# Patient Record
Sex: Female | Born: 1995 | Race: Black or African American | Hispanic: No | Marital: Single | Smoking: Never smoker
Health system: Southern US, Community
[De-identification: ages and names within clinical notes are randomized; demographics above are authoritative.]

## PROBLEM LIST (undated history)

## (undated) DIAGNOSIS — J45909 Unspecified asthma, uncomplicated: Secondary | ICD-10-CM

## (undated) DIAGNOSIS — T7840XA Allergy, unspecified, initial encounter: Secondary | ICD-10-CM

## (undated) DIAGNOSIS — F329 Major depressive disorder, single episode, unspecified: Secondary | ICD-10-CM

## (undated) DIAGNOSIS — D649 Anemia, unspecified: Secondary | ICD-10-CM

## (undated) DIAGNOSIS — F410 Panic disorder [episodic paroxysmal anxiety] without agoraphobia: Secondary | ICD-10-CM

## (undated) DIAGNOSIS — F32A Depression, unspecified: Secondary | ICD-10-CM

## (undated) HISTORY — DX: Allergy, unspecified, initial encounter: T78.40XA

## (undated) HISTORY — DX: Anemia, unspecified: D64.9

## (undated) HISTORY — DX: Unspecified asthma, uncomplicated: J45.909

---
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
## 2017-09-19 ENCOUNTER — Emergency Department (HOSPITAL_COMMUNITY)
Admission: EM | Admit: 2017-09-19 | Discharge: 2017-09-19 | Disposition: A | Discharge status: Other Acute Inpatient Hospital | Payer: BC Managed Care – PPO

## 2017-10-15 ENCOUNTER — Ambulatory Visit (HOSPITAL_COMMUNITY)
Admission: EM | Admit: 2017-10-15 | Discharge: 2017-10-15 | Disposition: A | Discharge status: Home / Self Care | Payer: BC Managed Care – PPO | Attending: Family Medicine | Admitting: Family Medicine

## 2017-10-15 ENCOUNTER — Encounter (HOSPITAL_COMMUNITY): Payer: Self-pay

## 2017-10-15 DIAGNOSIS — S39012A Strain of muscle, fascia and tendon of lower back, initial encounter: Secondary | ICD-10-CM | POA: Diagnosis not present

## 2017-10-15 DIAGNOSIS — Z041 Encounter for examination and observation following transport accident: Secondary | ICD-10-CM | POA: Diagnosis not present

## 2017-10-15 MED ORDER — DICLOFENAC SODIUM 75 MG PO TBEC: 75.0000 mg | DELAYED_RELEASE_TABLET | Freq: Two times a day (BID) | ORAL | 0 refills | Status: DC

## 2017-10-15 MED ORDER — CYCLOBENZAPRINE HCL 5 MG PO TABS: 5.0000 mg | ORAL_TABLET | Freq: Every day | ORAL | 0 refills | Status: DC
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
## 2017-10-29 ENCOUNTER — Inpatient Hospital Stay (HOSPITAL_COMMUNITY): Admission: AD | Admit: 2017-10-29 | Payer: BC Managed Care – PPO | Admitting: Psychiatry

## 2017-10-29 ENCOUNTER — Other Ambulatory Visit: Payer: Self-pay

## 2017-10-29 ENCOUNTER — Encounter (HOSPITAL_BASED_OUTPATIENT_CLINIC_OR_DEPARTMENT_OTHER): Payer: Self-pay | Admitting: *Deleted

## 2017-10-29 ENCOUNTER — Emergency Department (HOSPITAL_BASED_OUTPATIENT_CLINIC_OR_DEPARTMENT_OTHER)
Admission: EM | Admit: 2017-10-29 | Discharge: 2017-10-29 | Disposition: A | Discharge status: Home / Self Care | Payer: BC Managed Care – PPO | Attending: Emergency Medicine | Admitting: Emergency Medicine

## 2017-10-29 DIAGNOSIS — F329 Major depressive disorder, single episode, unspecified: Secondary | ICD-10-CM | POA: Diagnosis present

## 2017-10-29 DIAGNOSIS — F322 Major depressive disorder, single episode, severe without psychotic features: Secondary | ICD-10-CM | POA: Insufficient documentation

## 2017-10-29 DIAGNOSIS — R45851 Suicidal ideations: Secondary | ICD-10-CM | POA: Diagnosis not present

## 2017-10-29 DIAGNOSIS — Z79899 Other long term (current) drug therapy: Secondary | ICD-10-CM | POA: Insufficient documentation

## 2017-10-29 HISTORY — DX: Panic disorder (episodic paroxysmal anxiety): F41.0

## 2017-10-29 HISTORY — DX: Major depressive disorder, single episode, unspecified: F32.9

## 2017-10-29 HISTORY — DX: Depression, unspecified: F32.A

## 2017-10-29 LAB — CBC
HEMATOCRIT: 39.8 % (ref 36.0–46.0)
HEMOGLOBIN: 12.7 g/dL (ref 12.0–15.0)
MCH: 27.5 pg (ref 26.0–34.0)
MCHC: 31.9 g/dL (ref 30.0–36.0)
MCV: 86.1 fL (ref 80.0–100.0)
PLATELETS: 241 10*3/uL (ref 150–400)
RBC: 4.62 MIL/uL (ref 3.87–5.11)
RDW: 14.2 % (ref 11.5–15.5)
WBC: 5.7 10*3/uL (ref 4.0–10.5)
nRBC: 0 % (ref 0.0–0.2)

## 2017-10-29 LAB — RAPID URINE DRUG SCREEN, HOSP PERFORMED
AMPHETAMINES: NOT DETECTED
BARBITURATES: NOT DETECTED
Benzodiazepines: NOT DETECTED
Cocaine: NOT DETECTED
Opiates: NOT DETECTED
TETRAHYDROCANNABINOL: NOT DETECTED

## 2017-10-29 LAB — COMPREHENSIVE METABOLIC PANEL
ALK PHOS: 54 U/L (ref 38–126)
ALT: 16 U/L (ref 0–44)
AST: 22 U/L (ref 15–41)
Albumin: 4.3 g/dL (ref 3.5–5.0)
Anion gap: 9 (ref 5–15)
BUN: 16 mg/dL (ref 6–20)
CHLORIDE: 105 mmol/L (ref 98–111)
CO2: 25 mmol/L (ref 22–32)
CREATININE: 0.67 mg/dL (ref 0.44–1.00)
Calcium: 9.6 mg/dL (ref 8.9–10.3)
GFR calc Af Amer: >60 mL/min (ref >60)
GFR calc non Af Amer: >60 mL/min (ref >60)
Glucose, Bld: 93 mg/dL (ref 70–99)
Potassium: 3.6 mmol/L (ref 3.5–5.1)
SODIUM: 139 mmol/L (ref 135–145)
Total Bilirubin: 0.4 mg/dL (ref 0.3–1.2)
Total Protein: 7.6 g/dL (ref 6.5–8.1)

## 2017-10-29 LAB — SALICYLATE LEVEL: Salicylate Lvl: <7 mg/dL (ref 2.8–30.0)

## 2017-10-29 LAB — ACETAMINOPHEN LEVEL: Acetaminophen (Tylenol), Serum: <10 ug/mL — ABNORMAL LOW (ref 10–30)

## 2017-10-29 LAB — PREGNANCY, URINE: PREG TEST UR: NEGATIVE

## 2017-10-29 LAB — ETHANOL: Alcohol, Ethyl (B): <10 mg/dL (ref <10)

## 2017-10-29 NOTE — ED Notes (Signed)
ED Provider at bedside. 

## 2017-10-29 NOTE — ED Notes (Signed)
Pt talking with TTS telepsych.

## 2017-10-29 NOTE — ED Provider Notes (Signed)
22 yo F with a chief complaint of suicidal ideation.  Received the patient in signout from Dr. Ellender Hose.  Briefly the plan was for TTS evaluation.  Per him the concern was that the patient had stated that she was going to try and drive her car off the road to harm herself.  TTS had evaluated the patient and felt that she met inpatient criteria.  The family felt that there was a disconnect between their conversations.  They thought that by coming into the hospital she may get to see the psychiatrist sooner but be able to go home once that have been completed.  Once they found out that she would have a 72-hour at minimum admission but they felt that the patient would be better handled as an outpatient.  They try to recontact TTS who felt that at this point the patient would need to be involuntary committed.  The family and the patient seem reasonable on my exam.  The family is willing to contract for safety.  Patient is not suicidal at this time.  I feel that they are safe for discharge and outpatient psychiatric evaluation.   Deno Etienne, DO 10/29/17 1900

## 2017-10-29 NOTE — ED Notes (Signed)
Pt talking with TTS again

## 2017-10-29 NOTE — BH Assessment (Signed)
Tele Assessment Note   Patient Name: NYELA CORTINAS MRN: [id number] Referring Physician: Shaune Pollack MD Location of Patient: [facility] Location of Provider: Behavioral Health TTS Department  Heide Spark Lawn[place]ak is an 22 y.o. female presents to [facility] with worsening depression and SI with thoughts of running vehicle off the road. Pt accompanied by parents. Pt has not been compliant with medication the past few days and reports she has never been this depressed in her life. Pt reports feeling overwhelmed with feelings of depression, school and feeling like she was not as close to Jesus in her faith. Pt denies any current or past substance abuse problems. Pt does not appear to be intoxicated or in withdrawal at this time. Pt denies homicidal thoughts or physical aggression. Pt denies having access to firearms. Pt denies having any legal problems at this time. Pt denies hallucinations. Pt does not appear to be responding to internal stimuli and exhibits no delusional thought. Pt's reality testing appears to be intact. Pt has a job and goes to [employer]. Pr lives with roommates. Pt has no hs of inpatient services. Pt is prescribed meds by PCP and has a therapist at [employer].   Pt is dressed in street clothes, alert, oriented x4 with normal speech and normal motor behavior. Eye contact is good and Pt is calm. Pt's mood is depressed and affect is anxious. Thought process is coherent and relevant. Pt's insight is poor and judgement is impaired. There is no indication Pt is currently responding to internal stimuli or experiencing delusional thought content. Pt was cooperative throughout assessment. She says she is willing to sign voluntarily into a psychiatric facility    Diagnosis: F32.2 Major depressive disorder, Single episode, Severe   Past Medical History:  Past Medical History:  Diagnosis Date  . Depression   . Panic attacks     History reviewed. No pertinent surgical history.  Family  History: No family history on file.  Social History:  reports that she has never smoked. She has never used smokeless tobacco. She reports that she does not drink alcohol or use drugs.  Additional Social History:  Alcohol / Drug Use Pain Medications: See MAR Prescriptions: See MAR Over the Counter: See MAR History of alcohol / drug use?: No history of alcohol / drug abuse  CIWA: CIWA-Ar BP: 120/69 Pulse Rate: 74 COWS:    Allergies:  Allergies  Allergen Reactions  . Excedrin Extra Strength [Aspirin-Acetaminophen-Caffeine]     Home Medications:  (Not in a hospital admission)  OB/GYN Status:  Patient's last menstrual period was 10/18/2017.  General Assessment Data Location of Assessment: [facility] TTS Assessment: In system Is this a Tele or Face-to-Face Assessment?: Tele Assessment Is this an Initial Assessment or a Re-assessment for this encounter?: Initial Assessment Patient Accompanied by:: Parent What gender do you identify as?: Female Marital status: Single Pregnancy Status: No Living Arrangements: Non-relatives/Friends Can pt return to current living arrangement?: Yes Admission Status: Voluntary Is patient capable of signing voluntary admission?: Yes Referral Source: Self/Family/Friend Insurance type: BCBS  Medical Screening Exam [facility] Walk-in ONLY) Medical Exam completed: Yes  Crisis Care Plan Living Arrangements: Non-relatives/Friends Name of Psychiatrist: None Name of Therapist: [employer]  Education Status Is patient currently in school?: Yes Current Grade: [employer] Highest grade of school patient has completed: 12 Name of school: [employer]  Risk to self with the past 6 months Suicidal Ideation: Yes-Currently Present Has patient been a risk to self within the past 6 months prior to admission? :  No Suicidal Intent: No Has patient had any suicidal intent within the past 6 months prior to admission? : No Is patient at risk for suicide?: Yes Suicidal Plan?:  Yes-Currently Present Has patient had any suicidal plan within the past 6 months prior to admission? : No Specify Current Suicidal Plan: Run off the road Access to Means: No What has been your use of drugs/alcohol within the last 12 months?: None Previous Attempts/Gestures: No Intentional Self Injurious Behavior: None Family Suicide History: No Recent stressful life event(s): Conflict (Comment), Other (Comment)(College) Persecutory voices/beliefs?: No Depression: Yes Depression Symptoms: Despondent, Insomnia, Isolating, Feeling worthless/self pity Substance abuse history and/or treatment for substance abuse?: No Suicide prevention information given to non-admitted patients: Not applicable  Risk to Others within the past 6 months Homicidal Ideation: No Does patient have any lifetime risk of violence toward others beyond the six months prior to admission? : No Thoughts of Harm to Others: No Current Homicidal Intent: No Current Homicidal Plan: No Access to Homicidal Means: No History of harm to others?: No Assessment of Violence: None Noted Does patient have access to weapons?: No Criminal Charges Pending?: No Does patient have a court date: No Is patient on probation?: No  Psychosis Hallucinations: None noted Delusions: None noted  Mental Status Report Appearance/Hygiene: In scrubs Eye Contact: Good Motor Activity: Freedom of movement Speech: Logical/coherent Level of Consciousness: Quiet/awake Mood: Depressed Affect: Anxious Anxiety Level: Minimal Thought Processes: Coherent, Relevant Orientation: Person, Place, Time, Situation, Appropriate for developmental age Obsessive Compulsive Thoughts/Behaviors: None  Cognitive Functioning Is patient IDD: No Insight: Poor Impulse Control: Poor Appetite: Poor Have you had any weight changes? : No Change Sleep: Decreased Total Hours of Sleep: 4 Vegetative Symptoms: None  ADLScreening [facility] Assessment Services) Patient's  cognitive ability adequate to safely complete daily activities?: Yes Patient able to express need for assistance with ADLs?: Yes Independently performs ADLs?: Yes (appropriate for developmental age)  Prior Inpatient Therapy Prior Inpatient Therapy: No  Prior Outpatient Therapy Prior Outpatient Therapy: Yes Prior Therapy Dates: 2019 Prior Therapy Facilty/Provider(s): [facility] Reason for Treatment: Si Depression Does patient have an ACCT team?: No Does patient have Intensive In-House Services?  : No Does patient have [facility] services? : No Does patient have P4CC services?: No  ADL Screening (condition at time of admission) Patient's cognitive ability adequate to safely complete daily activities?: Yes Is the patient deaf or have difficulty hearing?: No Does the patient have difficulty seeing, even when wearing glasses/contacts?: No Does the patient have difficulty concentrating, remembering, or making decisions?: No Patient able to express need for assistance with ADLs?: Yes Does the patient have difficulty dressing or bathing?: No Independently performs ADLs?: Yes (appropriate for developmental age) Does the patient have difficulty walking or climbing stairs?: No Weakness of Legs: None Weakness of Arms/Hands: None  Home Assistive Devices/Equipment Home Assistive Devices/Equipment: None  Therapy Consults (therapy consults require a physician order) PT Evaluation Needed: No OT Evalulation Needed: No SLP Evaluation Needed: No Abuse/Neglect Assessment (Assessment to be complete while patient is alone) Abuse/Neglect Assessment Can Be Completed: Yes Physical Abuse: Denies Verbal Abuse: Denies Sexual Abuse: Denies Exploitation of patient/patient's resources: Denies Self-Neglect: Denies Values / Beliefs Cultural Requests During Hospitalization: None Spiritual Requests During Hospitalization: None Consults Spiritual Care Consult Needed: No Social Work Consult Needed: No Dispensing optician (For Healthcare) Does Patient Have a Medical Advance Directive?: No Would patient like information on creating a medical advance directive?: No - Patient declined Nutrition Screen- [facility] Adult/[facility]/AP Patient's home diet: Regular  Disposition:  Disposition Initial Assessment Completed for this Encounter: Yes Disposition of Patient: Admit Type of inpatient treatment program: Adult   Per Reola Calkins, NP pt meets inpatient criteria. Accepted to [facility].   This service was provided via telemedicine using a 2-way, interactive audio and video technology.  Names of all persons participating in this telemedicine service and their role in this encounter. Name: Nicola Police Role: Pt  Name: Danae [place], [place], [place] Role: Therapeutic Triage Specialist  Name:  Role:   Name:  Role:     Danae Orleans, [place], [facility] 10/29/2017 6:01 PM

## 2017-10-29 NOTE — ED Notes (Signed)
Pt discharged with parents. Pt agreeable to follow up. Pt signed Engineer, manufacturing systems. Copy provided to patient.
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
## 2017-10-29 NOTE — Discharge Instructions (Signed)
Follow up with your family doc/psychiatrist.  Return for any concern of Suicidally.

## 2017-10-29 NOTE — ED Triage Notes (Signed)
States she has had a series of panic attacks today. States she is in a depressed state today and does not know what is going on with her. She stopped taking her medication for depression several days ago.

## 2019-02-03 DIAGNOSIS — F319 Bipolar disorder, unspecified: Secondary | ICD-10-CM | POA: Insufficient documentation

## 2019-02-03 DIAGNOSIS — D509 Iron deficiency anemia, unspecified: Secondary | ICD-10-CM | POA: Insufficient documentation

## 2019-02-03 DIAGNOSIS — E559 Vitamin D deficiency, unspecified: Secondary | ICD-10-CM | POA: Insufficient documentation

## 2020-02-22 ENCOUNTER — Other Ambulatory Visit: Payer: Self-pay

## 2020-02-22 ENCOUNTER — Ambulatory Visit (HOSPITAL_COMMUNITY)
Admission: EM | Admit: 2020-02-22 | Discharge: 2020-02-22 | Disposition: A | Discharge status: Home / Self Care | Payer: Self-pay

## 2020-02-22 ENCOUNTER — Encounter (HOSPITAL_COMMUNITY): Payer: Self-pay

## 2020-02-22 ENCOUNTER — Ambulatory Visit (INDEPENDENT_AMBULATORY_CARE_PROVIDER_SITE_OTHER): Payer: Self-pay

## 2020-02-22 DIAGNOSIS — M25561 Pain in right knee: Secondary | ICD-10-CM

## 2020-02-22 DIAGNOSIS — M25562 Pain in left knee: Secondary | ICD-10-CM

## 2020-02-22 MED ORDER — CYCLOBENZAPRINE HCL 5 MG PO TABS: 5.0000 mg | ORAL_TABLET | Freq: Every evening | ORAL | 0 refills | Status: DC | PRN

## 2020-02-22 MED ORDER — IBUPROFEN 600 MG PO TABS: 600.0000 mg | ORAL_TABLET | Freq: Four times a day (QID) | ORAL | 0 refills | Status: DC | PRN

## 2020-02-22 MED ORDER — AMITRIPTYLINE HCL 10 MG PO TABS: 10.0000 mg | ORAL_TABLET | Freq: Every day | ORAL | 0 refills | Status: AC

## 2020-02-22 NOTE — Discharge Instructions (Addendum)
Take muscle relaxer at bedtime as needed   Take anti-inflammatory medication every 6 hours  Gentle stretching exercises as tolerated  Heating pad in 15 minute intervals for additional comfort  Follow up with psychiatry for evaluation of medications
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
## 2020-09-25 ENCOUNTER — Ambulatory Visit (HOSPITAL_COMMUNITY)
Admission: EM | Admit: 2020-09-25 | Discharge: 2020-09-25 | Disposition: A | Discharge status: Home / Self Care | Payer: BC Managed Care – PPO | Attending: Physician Assistant | Admitting: Physician Assistant

## 2020-09-25 ENCOUNTER — Other Ambulatory Visit: Payer: Self-pay

## 2020-09-25 ENCOUNTER — Encounter (HOSPITAL_COMMUNITY): Payer: Self-pay | Admitting: *Deleted

## 2020-09-25 DIAGNOSIS — L509 Urticaria, unspecified: Secondary | ICD-10-CM | POA: Diagnosis not present

## 2020-09-25 DIAGNOSIS — T7840XA Allergy, unspecified, initial encounter: Secondary | ICD-10-CM | POA: Diagnosis not present

## 2020-09-25 MED ORDER — EPINEPHRINE 0.3 MG/0.3ML IJ SOAJ: 0.3000 mg | INTRAMUSCULAR | 0 refills | Status: DC | PRN

## 2020-09-25 MED ORDER — METHYLPREDNISOLONE ACETATE 40 MG/ML IJ SUSP
40.0000 mg | Freq: Once | INTRAMUSCULAR | Status: AC
  Administered 2020-09-25: 40 mg via INTRAMUSCULAR

## 2020-09-25 MED ORDER — PREDNISONE 20 MG PO TABS: 40.0000 mg | ORAL_TABLET | Freq: Every day | ORAL | 0 refills | Status: AC

## 2020-09-25 MED ORDER — METHYLPREDNISOLONE ACETATE 40 MG/ML IJ SUSP
INTRAMUSCULAR | Status: AC
  Filled 2020-09-25: qty 1

## 2020-09-25 NOTE — ED Provider Notes (Signed)
[facility]    CSN: [id number] Arrival date & time: 09/25/20  1654      History   Chief Complaint Chief Complaint  Patient presents with   Allergic Reaction    HPI Angela Wolf is a 25 y.o. female.   Patient presents today with a several hour history of lip tingling and hives on her face after eating mahi-mahi.  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  She does report 1 episode of anaphylaxis but does not remember the trigger as this was many years ago.  She does not currently have an EpiPen available.  She has not seen an allergist but is interested in establishing with them.  She denies any shortness of breath, throat/tongue swelling, face swelling, changes to her voice.  She has not tried any over-the-counter medications for symptom management.  She is able to eat and drink despite symptoms.  Typically, she require steroids for improvement of symptoms and to avoid widespread urticaria.   Past Medical History:  Diagnosis Date   Depression    Panic attacks     There are no problems to display for this patient.   No past surgical history on file.  OB History   No obstetric history on file.      Home Medications    Prior to Admission medications   Medication Sig Start Date End Date Taking? Authorizing Provider  EPINEPHrine 0.3 mg/0.3 mL IJ SOAJ injection Inject 0.3 mg into the muscle as needed for anaphylaxis. 09/25/20  Yes Akirah Storck K, PA-C  predniSONE (DELTASONE) 20 MG tablet Take 2 tablets (40 mg total) by mouth daily for 3 days. 09/25/20 09/28/20 Yes Kaysen Sefcik, Noberto Retort, PA-C  amitriptyline (ELAVIL) 10 MG tablet Take 1 tablet (10 mg total) by mouth at bedtime. 02/22/20   White, Elita Boone, NP  cyclobenzaprine (FLEXERIL) 5 MG tablet Take 1 tablet (5 mg total) by mouth at bedtime as needed for muscle spasms. 02/22/20   Valinda Hoar, NP  diclofenac (VOLTAREN) 75 MG EC tablet Take 1 tablet (75 mg total) by mouth 2  (two) times daily. 10/15/17   Elvina Sidle, MD  ibuprofen (ADVIL) 600 MG tablet Take 1 tablet (600 mg total) by mouth every 6 (six) hours as needed. 02/22/20   Valinda Hoar, NP    Family History No family history on file.  Social History Social History   Tobacco Use   Smoking status: Never   Smokeless tobacco: Never  Substance Use Topics   Alcohol use: Never   Drug use: Never     Allergies   Excedrin extra strength [aspirin-acetaminophen-caffeine]   Review of Systems Review of Systems  Constitutional:  Negative for activity change, appetite change, fatigue and fever.  HENT:  Negative for congestion, facial swelling, sore throat, trouble swallowing and voice change.   Respiratory:  Negative for cough and shortness of breath.   Cardiovascular:  Negative for chest pain.  Gastrointestinal:  Negative for abdominal pain, diarrhea and vomiting.  Skin:  Positive for rash.  Neurological:  Negative for dizziness, light-headedness and headaches.    Physical Exam Triage Vital Signs ED Triage Vitals  Enc Vitals Group     BP 09/25/20 1831 125/86     Pulse Rate 09/25/20 1831 78     Resp 09/25/20 1831 18     Temp 09/25/20 1831 (!) 97.4 F (36.3 C)     Temp Source 09/25/20 1831 Oral  SpO2 09/25/20 1831 96 %     Weight --      Height --      Head Circumference --      Peak Flow --      Pain Score 09/25/20 1833 0     Pain Loc --      Pain Edu? --      Excl. in GC? --    No data found.  Updated Vital Signs BP 125/86   Pulse 78   Temp (!) 97.4 F (36.3 C) (Oral)   Resp 18   LMP 09/09/2020   SpO2 96%   Visual Acuity Right Eye Distance:   Left Eye Distance:   Bilateral Distance:    Right Eye Near:   Left Eye Near:    Bilateral Near:     Physical Exam Vitals reviewed.  Constitutional:      General: She is awake. She is not in acute distress.    Appearance: Normal appearance. She is well-developed. She is not ill-appearing.     Comments: Very pleasant  female appears stated age no acute distress sitting comfortably in exam room  HENT:     Head: Normocephalic and atraumatic.     Mouth/Throat:     Pharynx: Uvula midline. No oropharyngeal exudate or posterior oropharyngeal erythema.     Comments: Normal-appearing posterior oropharynx Cardiovascular:     Rate and Rhythm: Normal rate and regular rhythm.     Heart sounds: Normal heart sounds, S1 normal and S2 normal. No murmur heard. Pulmonary:     Effort: Pulmonary effort is normal.     Breath sounds: Normal breath sounds. No stridor. No wheezing, rhonchi or rales.     Comments: Clear to auscultation bilaterally Psychiatric:        Behavior: Behavior is cooperative.     UC Treatments / Results  Labs (all labs ordered are listed, but only abnormal results are displayed) Labs Reviewed - No data to display  EKG   Radiology No results found.  Procedures Procedures (including critical care time)  Medications Ordered in UC Medications  methylPREDNISolone acetate (DEPO-MEDROL) injection 40 mg (40 mg Intramuscular Given 09/25/20 1857)    Initial Impression / Assessment and Plan / UC Course  I have reviewed the triage vital signs and the nursing notes.  Pertinent labs & imaging results that were available during my care of the patient were reviewed by me and considered in my medical decision making (see chart for details).      Vital signs and physical exam reassuring today.  Patient was given 40 mg of Depo-Medrol in clinic today and started on prednisone burst (40 mg for 4 days) tomorrow.  She was encouraged to alternate antihistamines (Zyrtec in the morning and Pepcid at night) to help manage symptoms.  She was prescribed an EpiPen to have on hold but we discussed that should only be used if she has any throat swelling/shortness of breath.  If she requires use of this medication she needs to go directly to the emergency room.  Encouraged her to follow-up with an allergist as she has  several food allergies.  Discussed alarm symptoms that warrant emergent evaluation.  Strict return precautions given to which she expressed understanding.  Final Clinical Impressions(s) / UC Diagnoses   Final diagnoses:  Allergic reaction, initial encounter  Urticaria     Discharge Instructions      We have given you a steroid injection today.  Please start prednisone (40 mg for 3 days)  tomorrow.  Do not take NSAIDs including aspirin, ibuprofen/Advil, naproxen/Aleve with this medication as it can cause stomach bleeding.  Please alternate antihistamines (Zyrtec or cetirizine 10 mg in the morning and Pepcid or famotidine 20 mg at night).  I have called to an EpiPen to be used as needed for severe symptoms including shortness of breath or feeling as though your face/tongue swelling.  If you require use of this medication you need to go to the emergency room.  Please follow-up with an allergist as we discussed.  Avoid all seafood until evaluated by allergist.  If you have any worsening symptoms please go to the hospital.     ED Prescriptions     Medication Sig Dispense Auth. Provider   predniSONE (DELTASONE) 20 MG tablet Take 2 tablets (40 mg total) by mouth daily for 3 days. 6 tablet Haakon Titsworth K, PA-C   EPINEPHrine 0.3 mg/0.3 mL IJ SOAJ injection Inject 0.3 mg into the muscle as needed for anaphylaxis. 1 each Margorie Renner, Noberto Retort, PA-C      PDMP not reviewed this encounter.   Jeani Hawking, PA-C 09/25/20 1922

## 2020-09-25 NOTE — ED Triage Notes (Signed)
Pt reports Mahi-mahi  tonight and developed tingling of lips and facial break.

## 2020-09-25 NOTE — Discharge Instructions (Addendum)
We have given you a steroid injection today.  Please start prednisone (40 mg for 3 days) tomorrow.  Do not take NSAIDs including aspirin, ibuprofen/Advil, naproxen/Aleve with this medication as it can cause stomach bleeding.  Please alternate antihistamines (Zyrtec or cetirizine 10 mg in the morning and Pepcid or famotidine 20 mg at night).  I have called to an EpiPen to be used as needed for severe symptoms including shortness of breath or feeling as though your face/tongue swelling.  If you require use of this medication you need to go to the emergency room.  Please follow-up with an allergist as we discussed.  Avoid all seafood until evaluated by allergist.  If you have any worsening symptoms please go to the hospital.

## 2020-12-25 DIAGNOSIS — F411 Generalized anxiety disorder: Secondary | ICD-10-CM | POA: Insufficient documentation

## 2021-03-04 DIAGNOSIS — F4310 Post-traumatic stress disorder, unspecified: Secondary | ICD-10-CM | POA: Insufficient documentation

## 2021-04-02 ENCOUNTER — Other Ambulatory Visit: Payer: Self-pay

## 2021-04-02 ENCOUNTER — Ambulatory Visit (HOSPITAL_COMMUNITY)
Admission: EM | Admit: 2021-04-02 | Discharge: 2021-04-02 | Disposition: A | Discharge status: Home / Self Care | Payer: BC Managed Care – PPO | Attending: Family Medicine | Admitting: Family Medicine

## 2021-04-02 ENCOUNTER — Encounter (HOSPITAL_COMMUNITY): Payer: Self-pay | Admitting: Emergency Medicine

## 2021-04-02 DIAGNOSIS — M79641 Pain in right hand: Secondary | ICD-10-CM

## 2021-04-02 DIAGNOSIS — S60551A Superficial foreign body of right hand, initial encounter: Secondary | ICD-10-CM

## 2021-04-02 DIAGNOSIS — Z23 Encounter for immunization: Secondary | ICD-10-CM

## 2021-04-02 MED ORDER — HYDROCODONE-ACETAMINOPHEN 5-325 MG PO TABS: 1.0000 | ORAL_TABLET | Freq: Four times a day (QID) | ORAL | 0 refills | Status: DC | PRN

## 2021-04-02 MED ORDER — AMOXICILLIN-POT CLAVULANATE 875-125 MG PO TABS: 1.0000 | ORAL_TABLET | Freq: Two times a day (BID) | ORAL | 0 refills | Status: DC

## 2021-04-02 MED ORDER — TETANUS-DIPHTH-ACELL PERTUSSIS 5-2.5-18.5 LF-MCG/0.5 IM SUSY
0.5000 mL | PREFILLED_SYRINGE | Freq: Once | INTRAMUSCULAR | Status: AC
  Administered 2021-04-02: 0.5 mL via INTRAMUSCULAR

## 2021-04-02 MED ORDER — TETANUS-DIPHTH-ACELL PERTUSSIS 5-2.5-18.5 LF-MCG/0.5 IM SUSY
PREFILLED_SYRINGE | INTRAMUSCULAR | Status: AC
  Filled 2021-04-02: qty 0.5

## 2021-04-02 NOTE — Discharge Instructions (Addendum)

## 2021-04-02 NOTE — ED Triage Notes (Signed)
Pt reports a splinter in the palm of right hand since last night. States the splinter has gone too deep for self removal.  ?

## 2021-04-02 NOTE — ED Provider Notes (Addendum)
?[facility] ? ? ?[id number] ?04/02/21 Arrival Time: [phone] ? ?ASSESSMENT & PLAN: ? ?1. Right hand pain   ?2. Splinter of hand, right, initial encounter   ? ?Procedure Note ? ?Anesthesia: 2% plain lidocaine ? ?Procedure Details  ?The procedure, risks and complications have been discussed in detail (including, but not limited to pain and bleeding) with the patient. ? ?The skin induration was prepped and draped in the usual fashion. ?After adequate local anesthesia, a sub-cm incision made over R palm at reported entry site of wooden splinter with a #11 blade; careful dissection of underlying tissue without location of foreign body. Being cautious, I discussed that I do not want to go deeper into the palm. Will plan on hand surgery evaluation/follow up. Single 5-0 Ethilon suture to close wound. ? ?EBL: minimal ?Drains: none ?Packing: n/a ?Condition: Tolerated procedure well ?Complications: none. ? ?Finger ROM and capillary refill and distal sensation normal before and after procedure. ? ?Meds ordered this encounter  ?Medications  ? amoxicillin-clavulanate (AUGMENTIN) 875-125 MG tablet  ?  Sig: Take 1 tablet by mouth every 12 (twelve) hours.  ?  Dispense:  14 tablet  ?  Refill:  0  ? HYDROcodone-acetaminophen (NORCO/VICODIN) 5-325 MG tablet  ?  Sig: Take 1 tablet by mouth every 6 (six) hours as needed for moderate pain or severe pain.  ?  Dispense:  4 tablet  ?  Refill:  0  ? Tdap (BOOSTRIX) injection 0.5 mL  ? ?Reviewed expectations re: course of current medical issues. Questions answered. ?Outlined signs and symptoms indicating need for more acute intervention. ?Patient verbalized understanding. ?After Visit Summary given. ? ? ?SUBJECTIVE: ? ?Angela Wolf is a 26 y.o. female who presents with a possible wooden foreign body of her R palm; plywood. Painful yest evening; unsuccessful self-removal. No extremity sensation changes or weakness. Unsure of TD. ? ?OBJECTIVE: ? ?Vitals:  ? 04/02/21 1021 04/02/21  1023  ?BP:  101/64  ?Pulse:  67  ?Resp:  16  ?Temp:  98.7 ?F (37.1 ?C)  ?TempSrc:  Oral  ?SpO2:  100%  ?Weight: 52.2 kg   ?Height: 5' (1.524 m)   ?  ? ?General appearance: alert; no distress ?RUE: reported TTP over radial proximal palm; small cut present without active bleeding; unable to palpate any FB ?Psychological: alert and cooperative; normal mood and affect ? ?Allergies  ?Allergen Reactions  ? Excedrin Extra Strength [Aspirin-Acetaminophen-Caffeine]   ? ? ?Past Medical History:  ?Diagnosis Date  ? Depression   ? Panic attacks   ? ?Social History  ? ?Socioeconomic History  ? Marital status: Single  ?  Spouse name: Not on file  ? Number of children: Not on file  ? Years of education: Not on file  ? Highest education level: Not on file  ?Occupational History  ? Not on file  ?Tobacco Use  ? Smoking status: Never  ? Smokeless tobacco: Never  ?Substance and Sexual Activity  ? Alcohol use: Never  ? Drug use: Never  ? Sexual activity: Not on file  ?Other Topics Concern  ? Not on file  ?Social History Narrative  ? Not on file  ? ?Social Determinants of Health  ? ?Financial Resource Strain: Not on file  ?Food Insecurity: Not on file  ?Transportation Needs: Not on file  ?Physical Activity: Not on file  ?Stress: Not on file  ?Social Connections: Not on file  ? ?Family History  ?Problem Relation Age of Onset  ? Healthy Mother   ? Hypertension  Father   ? Asthma Father   ? Fibromyalgia Father   ? ?History reviewed. No pertinent surgical history. ? ? ? ? ? ? ? ?  ?Mardella Layman, MD ?04/02/21 1100 ? ?  ?Mardella Layman, MD ?04/02/21 1105 ? ?  ?Mardella Layman, MD ?04/02/21 1105 ? ?

## 2021-04-24 DIAGNOSIS — S60559A Superficial foreign body of unspecified hand, initial encounter: Secondary | ICD-10-CM | POA: Insufficient documentation

## 2021-06-18 DIAGNOSIS — F902 Attention-deficit hyperactivity disorder, combined type: Secondary | ICD-10-CM | POA: Insufficient documentation

## 2021-10-18 ENCOUNTER — Ambulatory Visit (HOSPITAL_COMMUNITY)
Admission: EM | Admit: 2021-10-18 | Discharge: 2021-10-18 | Disposition: A | Discharge status: Home / Self Care | Payer: BC Managed Care – PPO | Attending: Emergency Medicine | Admitting: Emergency Medicine

## 2021-10-18 DIAGNOSIS — L509 Urticaria, unspecified: Secondary | ICD-10-CM | POA: Diagnosis not present

## 2021-10-18 DIAGNOSIS — T7840XA Allergy, unspecified, initial encounter: Secondary | ICD-10-CM | POA: Diagnosis not present

## 2021-10-18 MED ORDER — METHYLPREDNISOLONE SODIUM SUCC 125 MG IJ SOLR
60.0000 mg | Freq: Once | INTRAMUSCULAR | Status: AC
  Administered 2021-10-18: 60 mg via INTRAMUSCULAR

## 2021-10-18 MED ORDER — METHYLPREDNISOLONE SODIUM SUCC 125 MG IJ SOLR
INTRAMUSCULAR | Status: AC
  Filled 2021-10-18: qty 2

## 2021-10-18 MED ORDER — PREDNISONE 20 MG PO TABS: 20.0000 mg | ORAL_TABLET | Freq: Every day | ORAL | 0 refills | Status: AC

## 2021-10-18 NOTE — ED Triage Notes (Signed)
Pt reports eating crab legs earlier today. She reports throat and arm pits feel itchy. Pt has not taken any medication to help with symptoms.

## 2021-10-18 NOTE — Discharge Instructions (Addendum)
We have given you a steroid injection today. Please take the oral prednisone as prescribed for the next 5 days.  I recommend trying the daily regimen of allergy medicine in the morning (zyrtec, allegra, etc), with pepcid at night time. This can help control your hives and itching.  Follow up with your primary care provider regarding allergies. Our clinic will reach out to you and help you establish within [facility] if you choose to do so.

## 2021-10-18 NOTE — ED Provider Notes (Signed)
[place]    CSN: [id number] Arrival date & time: 10/18/21  1709     History   Chief Complaint Chief Complaint  Patient presents with   Allergic Reaction    HPI Angela Wolf is a 26 y.o. female.  Presents with possible allergic reaction Reports she was eating lump crab and then developed itching in her throat and under her armpits Reports some lip swelling Denies swelling of tongue or throat No focal to breathing or shortness of breath.  Denies any abdominal pain  Reports history of this but not always with seafood She gets itching first and then her whole body erupts in hives  No medications PTA  Past Medical History:  Diagnosis Date   Depression    Panic attacks     There are no problems to display for this patient.   No past surgical history on file.  OB History   No obstetric history on file.      Home Medications    Prior to Admission medications   Medication Sig Start Date End Date Taking? Authorizing Provider  predniSONE (DELTASONE) 20 MG tablet Take 1 tablet (20 mg total) by mouth daily for 5 days. 10/18/21 10/23/21 Yes Izora Benn, Wells Guiles, PA-C  amitriptyline (ELAVIL) 10 MG tablet Take 1 tablet (10 mg total) by mouth at bedtime. 02/22/20   White, Leitha Schuller, NP  cyclobenzaprine (FLEXERIL) 5 MG tablet Take 1 tablet (5 mg total) by mouth at bedtime as needed for muscle spasms. 02/22/20   White, Leitha Schuller, NP  EPINEPHrine 0.3 mg/0.3 mL IJ SOAJ injection Inject 0.3 mg into the muscle as needed for anaphylaxis. 09/25/20   Raspet, Derry Skill, PA-C  HYDROcodone-acetaminophen (NORCO/VICODIN) 5-325 MG tablet Take 1 tablet by mouth every 6 (six) hours as needed for moderate pain or severe pain. 04/02/21   Vanessa Kick, MD    Family History Family History  Problem Relation Age of Onset   Healthy Mother    Hypertension Father    Asthma Father    Fibromyalgia Father     Social History Social History   Tobacco Use   Smoking status: Never    Smokeless tobacco: Never  Substance Use Topics   Alcohol use: Never   Drug use: Never     Allergies   Excedrin extra strength [aspirin-acetaminophen-caffeine]   Review of Systems Review of Systems Per HPI  Physical Exam Triage Vital Signs ED Triage Vitals [10/18/21 1728]  Enc Vitals Group     BP 105/74     Pulse Rate 80     Resp 16     Temp 98.7 F (37.1 C)     Temp Source Oral     SpO2 98 %     Weight      Height      Head Circumference      Peak Flow      Pain Score      Pain Loc      Pain Edu?      Excl. in [place]?    No data found.  Updated Vital Signs BP 105/74 (BP Location: Left Arm)   Pulse 80   Temp 98.7 F (37.1 C) (Oral)   Resp 16   SpO2 98%   Physical Exam Vitals and nursing note reviewed.  Constitutional:      General: She is not in acute distress.    Appearance: Normal appearance.     Comments: No acute distress  HENT:     Head:  Comments: No tongue or lip swelling noted. Speaks in normal sentences    Mouth/Throat:     Mouth: Mucous membranes are moist.     Pharynx: Oropharynx is clear. No posterior oropharyngeal erythema.  Eyes:     Conjunctiva/sclera: Conjunctivae normal.     Pupils: Pupils are equal, round, and reactive to light.  Cardiovascular:     Rate and Rhythm: Normal rate and regular rhythm.     Pulses: Normal pulses.     Heart sounds: Normal heart sounds.  Pulmonary:     Effort: Pulmonary effort is normal. No respiratory distress.     Breath sounds: Normal breath sounds. No wheezing.  Abdominal:     Tenderness: There is no abdominal tenderness.  Musculoskeletal:        General: Normal range of motion.     Cervical back: Normal range of motion.  Skin:    Findings: No rash.  Neurological:     Mental Status: She is alert and oriented to person, place, and time.     UC Treatments / Results  Labs (all labs ordered are listed, but only abnormal results are displayed) Labs Reviewed - No data to  display  EKG   Radiology No results found.  Procedures Procedures   Medications Ordered in UC Medications  methylPREDNISolone sodium succinate (SOLU-MEDROL) 125 mg/2 mL injection 60 mg (60 mg Intramuscular Given 10/18/21 1747)    Initial Impression / Assessment and Plan / UC Course  I have reviewed the triage vital signs and the nursing notes.  Pertinent labs & imaging results that were available during my care of the patient were reviewed by me and considered in my medical decision making (see chart for details).  IM Solu-Medrol given. Reports itching improved. Will do small prednisone taper to control itching and hives  Recommend antihistamine regimen, daily Zyrtec with nightly Pepcid.  Discussed follow-up with primary care provider and potential referral to allergy specialist.  Added to PCP list. Return precautions discussed. Strict ED precautions. Patient agrees to plan  Final Clinical Impressions(s) / UC Diagnoses   Final diagnoses:  Allergic reaction, initial encounter  Hives     Discharge Instructions      We have given you a steroid injection today. Please take the oral prednisone as prescribed for the next 5 days.  I recommend trying the daily regimen of allergy medicine in the morning (zyrtec, allegra, etc), with pepcid at night time. This can help control your hives and itching.  Follow up with your primary care provider regarding allergies. Our clinic will reach out to you and help you establish within [facility] if you choose to do so.    ED Prescriptions     Medication Sig Dispense Auth. Provider   predniSONE (DELTASONE) 20 MG tablet Take 1 tablet (20 mg total) by mouth daily for 5 days. 5 tablet Tara Wich, Wells Guiles, PA-C      PDMP not reviewed this encounter.   Dyllan Kats, Wells Guiles, [place] 10/18/21 1826

## 2021-12-15 ENCOUNTER — Ambulatory Visit (INDEPENDENT_AMBULATORY_CARE_PROVIDER_SITE_OTHER): Payer: BC Managed Care – PPO

## 2021-12-15 ENCOUNTER — Ambulatory Visit (INDEPENDENT_AMBULATORY_CARE_PROVIDER_SITE_OTHER): Payer: BC Managed Care – PPO | Admitting: Podiatry

## 2021-12-15 DIAGNOSIS — M79671 Pain in right foot: Secondary | ICD-10-CM

## 2021-12-15 DIAGNOSIS — M25471 Effusion, right ankle: Secondary | ICD-10-CM | POA: Diagnosis not present

## 2021-12-15 DIAGNOSIS — S93401A Sprain of unspecified ligament of right ankle, initial encounter: Secondary | ICD-10-CM

## 2021-12-15 MED ORDER — METHYLPREDNISOLONE 4 MG PO TBPK: ORAL_TABLET | ORAL | 0 refills | Status: DC

## 2021-12-15 MED ORDER — MELOXICAM 7.5 MG PO TABS: 7.5000 mg | ORAL_TABLET | Freq: Every day | ORAL | 0 refills | Status: DC | PRN

## 2021-12-15 NOTE — Patient Instructions (Addendum)
Start with the medrol dose pack and once compete start the mobic, do not take together  Ankle Sprain, Phase I Rehab An ankle sprain is an injury to the ligaments of your ankle. Ankle sprains cause stiffness, loss of motion, and loss of strength. Ask your health care provider which exercises are safe for you. Do exercises exactly as told by your health care provider and adjust them as directed. It is normal to feel mild stretching, pulling, tightness, or discomfort as you do these exercises. Stop right away if you feel sudden pain or your pain gets worse. Do not begin these exercises until told by your health care provider. Stretching and range-of-motion exercises These exercises warm up your muscles and joints and improve the movement and flexibility of your lower leg and ankle. These exercises also help to relieve pain and stiffness. Gastroc and soleus stretch This exercise is also called a calf stretch. It stretches the muscles in the back of the lower leg. These muscles are the gastrocnemius, or gastroc, and the soleus. Sit on the floor with your left / right leg extended. Loop a belt or towel around the ball of your left / right foot. The ball of your foot is on the walking surface, right under your toes. Keep your left / right ankle and foot relaxed and keep your knee straight while you use the belt or towel to pull your foot toward you. You should feel a gentle stretch behind your calf or knee in your gastroc muscle. Hold this position for __________ seconds, then release to the starting position. Repeat the exercise with your knee bent. You can put a pillow or a rolled bath towel under your knee to support it. You should feel a stretch deep in your calf in the soleus muscle or at your Achilles tendon. Repeat __________ times. Complete this exercise __________ times a day. Ankle alphabet  Sit with your left / right leg supported at the lower leg. Do not rest your foot on anything. Make sure  your foot has room to move freely. Think of your left / right foot as a paintbrush. Move your foot to trace each letter of the alphabet in the air. Keep your hip and knee still while you trace. Make the letters as large as you can without feeling discomfort. Trace every letter from A to Z. Repeat __________ times. Complete this exercise __________ times a day. Strengthening exercises These exercises build strength and endurance in your ankle and lower leg. Endurance is the ability to use your muscles for a long time, even after they get tired. Ankle dorsiflexion  Secure a rubber exercise band or tube to an object, such as a table leg, that will stay still when the band is pulled. Secure the other end around your left / right foot. Sit on the floor facing the object, with your left / right leg extended. The band or tube should be slightly tense when your foot is relaxed. Slowly bring your foot toward you, bringing the top of your foot toward your shin (dorsiflexion), and pulling the band tighter. Hold this position for __________ seconds. Slowly return your foot to the starting position. Repeat __________ times. Complete this exercise __________ times a day. Ankle plantar flexion  Sit on the floor with your left / right leg extended. Loop a rubber exercise tube or band around the ball of your left / right foot. The ball of your foot is on the walking surface, right under your toes. Hold  the ends of the band or tube in your hands. The band or tube should be slightly tense when your foot is relaxed. Slowly point your foot and toes downward to tilt the top of your foot away from your shin (plantar flexion). Hold this position for __________ seconds. Slowly return your foot to the starting position. Repeat __________ times. Complete this exercise __________ times a day. Ankle eversion Sit on the floor with your legs straight out in front of you. Loop a rubber exercise band or tube around the  ball of your left / right foot. The ball of your foot is on the walking surface, right under your toes. Hold the ends of the band in your hands, or secure the band to a stable object. The band or tube should be slightly tense when your foot is relaxed. Slowly push your foot outward, away from your other leg (eversion). Hold this position for __________ seconds. Slowly return your foot to the starting position. Repeat __________ times. Complete this exercise __________ times a day. This information is not intended to replace advice given to you by your health care provider. Make sure you discuss any questions you have with your health care provider. Document Revised: 02/20/2020 Document Reviewed: 02/22/2020 Elsevier Patient Education  [place].
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
## 2022-01-07 ENCOUNTER — Other Ambulatory Visit: Payer: Self-pay | Admitting: Podiatry

## 2022-01-07 DIAGNOSIS — M79671 Pain in right foot: Secondary | ICD-10-CM

## 2022-01-07 DIAGNOSIS — S93401A Sprain of unspecified ligament of right ankle, initial encounter: Secondary | ICD-10-CM

## 2022-01-07 DIAGNOSIS — M25471 Effusion, right ankle: Secondary | ICD-10-CM

## 2022-05-28 ENCOUNTER — Emergency Department (HOSPITAL_COMMUNITY)
Admission: EM | Admit: 2022-05-28 | Discharge: 2022-05-29 | Disposition: A | Discharge status: Home / Self Care | Payer: BC Managed Care – PPO | Attending: Emergency Medicine | Admitting: Emergency Medicine

## 2022-05-28 ENCOUNTER — Other Ambulatory Visit: Payer: Self-pay

## 2022-05-28 DIAGNOSIS — H5712 Ocular pain, left eye: Secondary | ICD-10-CM | POA: Diagnosis present
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
## 2022-05-29 ENCOUNTER — Emergency Department (HOSPITAL_COMMUNITY): Payer: BC Managed Care – PPO

## 2022-05-29 MED ORDER — TETRACAINE HCL 0.5 % OP SOLN
1.0000 [drp] | Freq: Once | OPHTHALMIC | Status: AC
  Administered 2022-05-29: 1 [drp] via OPHTHALMIC
  Filled 2022-05-29: qty 4

## 2022-05-29 MED ORDER — FLUORESCEIN SODIUM 1 MG OP STRP
1.0000 | ORAL_STRIP | Freq: Once | OPHTHALMIC | Status: AC
  Administered 2022-05-29: 1 via OPHTHALMIC
  Filled 2022-05-29: qty 1
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
## 2022-05-29 NOTE — Discharge Instructions (Signed)
Please follow-up with your eye doctor.  Return for new or worsening symptoms.
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
## 2023-01-17 ENCOUNTER — Emergency Department (HOSPITAL_BASED_OUTPATIENT_CLINIC_OR_DEPARTMENT_OTHER): Payer: 59

## 2023-01-17 ENCOUNTER — Emergency Department (HOSPITAL_BASED_OUTPATIENT_CLINIC_OR_DEPARTMENT_OTHER)
Admission: EM | Admit: 2023-01-17 | Discharge: 2023-01-17 | Disposition: A | Discharge status: Home / Self Care | Payer: Self-pay | Attending: Emergency Medicine | Admitting: Emergency Medicine

## 2023-01-17 ENCOUNTER — Other Ambulatory Visit: Payer: Self-pay

## 2023-01-17 ENCOUNTER — Encounter (HOSPITAL_BASED_OUTPATIENT_CLINIC_OR_DEPARTMENT_OTHER): Payer: Self-pay | Admitting: Emergency Medicine

## 2023-01-17 DIAGNOSIS — R059 Cough, unspecified: Secondary | ICD-10-CM | POA: Diagnosis present

## 2023-01-17 DIAGNOSIS — Z20822 Contact with and (suspected) exposure to covid-19: Secondary | ICD-10-CM | POA: Diagnosis not present

## 2023-01-17 DIAGNOSIS — J40 Bronchitis, not specified as acute or chronic: Secondary | ICD-10-CM | POA: Diagnosis not present

## 2023-01-17 LAB — RESP PANEL BY RT-PCR (RSV, FLU A&B, COVID)  RVPGX2
Influenza A by PCR: NEGATIVE
Influenza B by PCR: NEGATIVE
Resp Syncytial Virus by PCR: NEGATIVE
SARS Coronavirus 2 by RT PCR: NEGATIVE

## 2023-01-17 MED ORDER — AZITHROMYCIN 250 MG PO TABS: 250.0000 mg | ORAL_TABLET | Freq: Every day | ORAL | 0 refills | Status: DC

## 2023-01-17 MED ORDER — PREDNISONE 20 MG PO TABS: 20.0000 mg | ORAL_TABLET | Freq: Every day | ORAL | 0 refills | Status: AC

## 2023-01-17 NOTE — Discharge Instructions (Signed)
 It was a pleasure taking care of you here in the emergency department.  Your chest x-ray was reassuring.  Your COVID, flu, RSV test was negative.  You possibly have bronchitis versus early developing pneumonia.  We have written you for some steroids as well as antibiotics.  Take as prescribed.  It will take about 3 days to start feeling improved.  Make sure to follow-up outpatient, return for any worsening symptoms

## 2023-01-17 NOTE — ED Notes (Signed)
 Reviewed AVS/discharge instruction with patient. Time allotted for and all questions answered. Patient is agreeable for d/c and escorted to ed exit by staff.

## 2023-01-17 NOTE — ED Provider Notes (Signed)
 [facility] EMERGENCY DEPARTMENT AT [facility] Provider Note   CSN: [id number] Arrival date & time: 01/17/23  1601    History  Chief Complaint  Patient presents with   Cough    Angela Wolf is a 28 y.o. female here for evaluation of cough. Symptoms for greater than 1 week.  Cough productive of green mucus.  Some myalgias.  Feels short of breath when she coughs.  No history of PE or DVT.  No pain or swelling to lower legs.  No recent surgery, immobilization or malignancy.  No sick contacts.  She initially thought she is feeling better on Thursday, symptoms have improved then subsequently worsened.  No fever, back pain, CP, abdominal pain, nausea or vomiting.  She denies chance of pregnancy  HPI     Home Medications Prior to Admission medications   Medication Sig Start Date End Date Taking? Authorizing Provider  azithromycin  (ZITHROMAX ) 250 MG tablet Take 1 tablet (250 mg total) by mouth daily. Take first 2 tablets together, then 1 every day until finished. 01/17/23  Yes Adasia Hoar A, PA-C  predniSONE  (DELTASONE ) 20 MG tablet Take 1 tablet (20 mg total) by mouth daily for 5 days. 01/17/23 01/22/23 Yes Erion Weightman A, PA-C  amitriptyline  (ELAVIL ) 10 MG tablet Take 1 tablet (10 mg total) by mouth at bedtime. 02/22/20   Teresa Shelba SAUNDERS, NP  EPINEPHrine  0.3 mg/0.3 mL IJ SOAJ injection Inject 0.3 mg into the muscle as needed for anaphylaxis. 09/25/20   Raspet, Erin K, PA-C      Allergies    Blue crab (cavinectes sapidus) allergy skin test and Excedrin extra strength [aspirin-acetaminophen -caffeine]    Review of Systems   Review of Systems  Constitutional:  Positive for activity change, appetite change, chills and fatigue.  HENT:  Positive for congestion.   Respiratory:  Positive for cough and shortness of breath.   Cardiovascular: Negative.   Gastrointestinal: Negative.   Genitourinary: Negative.   Musculoskeletal: Negative.   Skin: Negative.   Neurological:  Negative.   All other systems reviewed and are negative.   Physical Exam Updated Vital Signs BP 98/62   Pulse 61   Temp 98.8 F (37.1 C) (Oral)   Resp 18   LMP 01/10/2023 (Approximate)   SpO2 100%  Physical Exam Vitals and nursing note reviewed.  Constitutional:      General: She is not in acute distress.    Appearance: She is well-developed. She is not ill-appearing, toxic-appearing or diaphoretic.  HENT:     Head: Normocephalic and atraumatic.     Nose: Nose normal.     Mouth/Throat:     Mouth: Mucous membranes are moist.  Eyes:     Pupils: Pupils are equal, round, and reactive to light.  Cardiovascular:     Rate and Rhythm: Normal rate.     Pulses: Normal pulses.     Heart sounds: Normal heart sounds.  Pulmonary:     Effort: Pulmonary effort is normal. No respiratory distress.     Breath sounds: Normal breath sounds.  Abdominal:     General: Bowel sounds are normal. There is no distension.     Palpations: Abdomen is soft.     Tenderness: There is no abdominal tenderness. There is no right CVA tenderness, left CVA tenderness, guarding or rebound.  Musculoskeletal:        General: No swelling, tenderness, deformity or signs of injury. Normal range of motion.     Cervical back: Normal range of motion.  Right lower leg: No edema.     Left lower leg: No edema.  Skin:    General: Skin is warm and dry.     Capillary Refill: Capillary refill takes less than 2 seconds.  Neurological:     General: No focal deficit present.     Mental Status: She is alert.     Cranial Nerves: No cranial nerve deficit.     Sensory: No sensory deficit.     Motor: No weakness.     Gait: Gait normal.  Psychiatric:        Mood and Affect: Mood normal.     ED Results / Procedures / Treatments   Labs (all labs ordered are listed, but only abnormal results are displayed) Labs Reviewed  RESP PANEL BY RT-PCR (RSV, FLU A&B, COVID)  [id number]    EKG None  Radiology DG Chest Portable 1  View Result Date: 01/17/2023 CLINICAL DATA:  Cough, congestion EXAM: PORTABLE CHEST 1 VIEW COMPARISON:  12/04/2007 FINDINGS: The heart size and mediastinal contours are within normal limits. Both lungs are clear. The visualized skeletal structures are unremarkable. IMPRESSION: No acute abnormality lungs in AP portable projection. Electronically Signed   By: Marolyn JONETTA Jaksch M.D.   On: 01/17/2023 16:20    Procedures Procedures    Medications Ordered in ED Medications - No data to display  ED Course/ Medical Decision Making/ A&P   28 year old here for evaluation URI symptoms.  Biphasic, initially felt improved however then returned.  On arrival she is afebrile, nonseptic, not ill-appearing.  No CP.  No clinical evidence of VTE.  She is PERC negative.  No hemoptysis.  No neck stiffness or neck rigidity, no meningismus have low suspicion for meningitis.  Abdomen soft, nontender.  She denies chance of pregnancy.  No sick contacts.  Labs and imaging personally viewed and interpreted:  RSV negative Chest x-ray without overt pneumonia  Discussed results with patient.  Possibly pneumonia given biphasic symptoms.  She will return to any new or worsening symptoms  BP here 97/67--extensive review of her prior medical records.  She appears to read in the 90s to low 100 systolic at baseline.  Low suspicion for sepsis  The patient has been appropriately medically screened and/or stabilized in the ED. I have low suspicion for any other emergent medical condition which would require further screening, evaluation or treatment in the ED or require inpatient management.  Patient is hemodynamically stable and in no acute distress.  Patient able to ambulate in department prior to ED.  Evaluation does not show acute pathology that would require ongoing or additional emergent interventions while in the emergency department or further inpatient treatment.  I have discussed the diagnosis with the patient and answered  all questions.  Pain is been managed while in the emergency department and patient has no further complaints prior to discharge.  Patient is comfortable with plan discussed in room and is stable for discharge at this time.  I have discussed strict return precautions for returning to the emergency department.  Patient was encouraged to follow-up with PCP/specialist refer to at discharge.                                Medical Decision Making Amount and/or Complexity of Data Reviewed External Data Reviewed: labs, radiology and notes. Labs: ordered. Decision-making details documented in ED Course. Radiology: ordered and independent interpretation performed. Decision-making details documented in ED Course.  Risk OTC drugs. Prescription drug management. Decision regarding hospitalization. Diagnosis or treatment significantly limited by social determinants of health.          Final Clinical Impression(s) / ED Diagnoses Final diagnoses:  Bronchitis    Rx / DC Orders ED Discharge Orders          Ordered    azithromycin  (ZITHROMAX ) 250 MG tablet  Daily        01/17/23 1714    predniSONE  (DELTASONE ) 20 MG tablet  Daily        01/17/23 1714              Brailee Riede A, PA-C 01/17/23 1719    Curatolo, Adam, DO 01/17/23 1750

## 2023-01-17 NOTE — ED Triage Notes (Signed)
 Cough URI symptoms, body aches Since Monday

## 2023-02-06 ENCOUNTER — Emergency Department (HOSPITAL_BASED_OUTPATIENT_CLINIC_OR_DEPARTMENT_OTHER)
Admission: EM | Admit: 2023-02-06 | Discharge: 2023-02-06 | Disposition: A | Discharge status: Home / Self Care | Payer: 59 | Attending: Emergency Medicine | Admitting: Emergency Medicine

## 2023-02-06 ENCOUNTER — Other Ambulatory Visit: Payer: Self-pay

## 2023-02-06 ENCOUNTER — Encounter (HOSPITAL_BASED_OUTPATIENT_CLINIC_OR_DEPARTMENT_OTHER): Payer: Self-pay | Admitting: Emergency Medicine

## 2023-02-06 DIAGNOSIS — K625 Hemorrhage of anus and rectum: Secondary | ICD-10-CM | POA: Insufficient documentation

## 2023-02-06 DIAGNOSIS — K649 Unspecified hemorrhoids: Secondary | ICD-10-CM | POA: Diagnosis not present

## 2023-02-06 MED ORDER — HYDROCORTISONE ACETATE 25 MG RE SUPP: 25.0000 mg | Freq: Two times a day (BID) | RECTAL | 1 refills | Status: DC

## 2023-02-06 MED ORDER — DOCUSATE SODIUM 100 MG PO CAPS: 100.0000 mg | ORAL_CAPSULE | Freq: Two times a day (BID) | ORAL | 2 refills | Status: DC

## 2023-02-06 MED ORDER — HYDROCODONE-ACETAMINOPHEN 5-325 MG PO TABS: 1.0000 | ORAL_TABLET | ORAL | 0 refills | Status: DC | PRN
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
## 2023-03-05 ENCOUNTER — Encounter: Payer: Self-pay | Admitting: Physician Assistant

## 2023-03-05 ENCOUNTER — Ambulatory Visit: Payer: 59 | Admitting: Physician Assistant

## 2023-03-05 VITALS — BP 100/70 | HR 74 | Temp 97.2°F | Ht 60.0 in | Wt 131.0 lb

## 2023-03-05 DIAGNOSIS — E538 Deficiency of other specified B group vitamins: Secondary | ICD-10-CM | POA: Insufficient documentation

## 2023-03-05 DIAGNOSIS — E559 Vitamin D deficiency, unspecified: Secondary | ICD-10-CM

## 2023-03-05 DIAGNOSIS — Z13220 Encounter for screening for lipoid disorders: Secondary | ICD-10-CM | POA: Diagnosis not present

## 2023-03-05 DIAGNOSIS — Z833 Family history of diabetes mellitus: Secondary | ICD-10-CM | POA: Diagnosis not present

## 2023-03-05 DIAGNOSIS — D508 Other iron deficiency anemias: Secondary | ICD-10-CM | POA: Diagnosis not present

## 2023-03-05 DIAGNOSIS — F39 Unspecified mood [affective] disorder: Secondary | ICD-10-CM | POA: Insufficient documentation

## 2023-03-05 LAB — CBC WITH DIFFERENTIAL/PLATELET
Basophils Absolute: 0 10*3/uL (ref 0.0–0.1)
Basophils Relative: 0.5 % (ref 0.0–3.0)
Eosinophils Absolute: 0.1 10*3/uL (ref 0.0–0.7)
Eosinophils Relative: 1.9 % (ref 0.0–5.0)
HCT: 42.4 % (ref 36.0–46.0)
Hemoglobin: 13.9 g/dL (ref 12.0–15.0)
Lymphocytes Relative: 26 % (ref 12.0–46.0)
Lymphs Abs: 1.1 10*3/uL (ref 0.7–4.0)
MCHC: 32.7 g/dL (ref 30.0–36.0)
MCV: 87.6 fL (ref 78.0–100.0)
Monocytes Absolute: 0.3 10*3/uL (ref 0.1–1.0)
Monocytes Relative: 7.4 % (ref 3.0–12.0)
Neutro Abs: 2.6 10*3/uL (ref 1.4–7.7)
Neutrophils Relative %: 64.2 % (ref 43.0–77.0)
Platelets: 265 10*3/uL (ref 150.0–400.0)
RBC: 4.85 Mil/uL (ref 3.87–5.11)
RDW: 14.6 % (ref 11.5–15.5)
WBC: 4.1 10*3/uL (ref 4.0–10.5)

## 2023-03-05 LAB — COMPREHENSIVE METABOLIC PANEL
ALT: 17 U/L (ref 0–35)
AST: 20 U/L (ref 0–37)
Albumin: 4.8 g/dL (ref 3.5–5.2)
Alkaline Phosphatase: 47 U/L (ref 39–117)
BUN: 16 mg/dL (ref 6–23)
CO2: 30 meq/L (ref 19–32)
Calcium: 9.5 mg/dL (ref 8.4–10.5)
Chloride: 101 meq/L (ref 96–112)
Creatinine, Ser: 0.84 mg/dL (ref 0.40–1.20)
GFR: 96.3 mL/min (ref >60.00)
Glucose, Bld: 91 mg/dL (ref 70–99)
Potassium: 4.1 meq/L (ref 3.5–5.1)
Sodium: 138 meq/L (ref 135–145)
Total Bilirubin: 0.4 mg/dL (ref 0.2–1.2)
Total Protein: 7.8 g/dL (ref 6.0–8.3)

## 2023-03-05 LAB — VITAMIN B12: Vitamin B-12: 420 pg/mL (ref 211–911)

## 2023-03-05 LAB — LIPID PANEL
Cholesterol: 236 mg/dL — ABNORMAL HIGH (ref 0–200)
HDL: 102.7 mg/dL (ref >39.00)
LDL Cholesterol: 122 mg/dL — ABNORMAL HIGH (ref 0–99)
NonHDL: 133.31
Total CHOL/HDL Ratio: 2
Triglycerides: 55 mg/dL (ref 0.0–149.0)
VLDL: 11 mg/dL (ref 0.0–40.0)

## 2023-03-05 LAB — IBC + FERRITIN
Ferritin: 18.1 ng/mL (ref 10.0–291.0)
Iron: 75 ug/dL (ref 42–145)
Saturation Ratios: 18.9 % — ABNORMAL LOW (ref 20.0–50.0)
TIBC: 397.6 ug/dL (ref 250.0–450.0)
Transferrin: 284 mg/dL (ref 212.0–360.0)

## 2023-03-05 LAB — VITAMIN D 25 HYDROXY (VIT D DEFICIENCY, FRACTURES): VITD: 26.88 ng/mL — ABNORMAL LOW (ref 30.00–100.00)

## 2023-03-05 LAB — HEMOGLOBIN A1C: Hgb A1c MFr Bld: 5.5 % (ref 4.6–6.5)

## 2023-03-05 NOTE — Patient Instructions (Signed)
 Welcome to [facility]! It was a pleasure meeting you today.   PLEASE NOTE:  If you had any LAB tests please let us know if you have not heard back within a few days. You may see your results on MyChart before we have a chance to review them but we will give you a call once they are reviewed by [place]. If we ordered any REFERRALS today, please let us know if you have not heard from their office within the next two weeks. Let us know through MyChart if you are needing REFILLS, or have your pharmacy send [place] the request. You can also use MyChart to communicate with me or any office staff.  Please try these tips to maintain a healthy lifestyle:  Eat most of your calories during the day when you are active. Eliminate processed foods including packaged sweets (pies, cakes, cookies), reduce intake of potatoes, white bread, white pasta, and white rice. Look for whole grain options, oat flour or almond flour.  Each meal should contain half fruits/vegetables, one quarter protein, and one quarter carbs (no bigger than a computer mouse).  Cut down on sweet beverages. This includes juice, soda, and sweet tea. Also watch fruit intake, though this is a healthier sweet option, it still contains natural sugar! Limit to 3 servings daily.  Drink at least 1 glass of water with each meal and aim for at least 8 glasses (64 ounces) per day.  Exercise at least 150 minutes every week to the best of your ability.    Take Care,  Audel Coakley, PA-C
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
## 2023-03-07 ENCOUNTER — Encounter: Payer: Self-pay | Admitting: Physician Assistant

## 2023-03-08 ENCOUNTER — Encounter: Payer: Self-pay | Admitting: Physician Assistant

## 2023-03-09 NOTE — Telephone Encounter (Signed)
 Please see patient message, wanting to discuss recent lab results

## 2023-03-09 NOTE — Telephone Encounter (Signed)
 Please see Angela Wolf's message below and schedule VV to discuss recent lab results

## 2023-03-16 ENCOUNTER — Encounter: Payer: Self-pay | Admitting: Physician Assistant

## 2023-03-16 ENCOUNTER — Telehealth: Payer: 59 | Admitting: Physician Assistant

## 2023-03-16 VITALS — Ht 60.0 in | Wt 131.0 lb

## 2023-03-16 DIAGNOSIS — E782 Mixed hyperlipidemia: Secondary | ICD-10-CM

## 2023-03-16 DIAGNOSIS — E559 Vitamin D deficiency, unspecified: Secondary | ICD-10-CM | POA: Diagnosis not present

## 2023-03-16 MED ORDER — VITAMIN D (ERGOCALCIFEROL) 1.25 MG (50000 UNIT) PO CAPS: 50000.0000 [IU] | ORAL_CAPSULE | ORAL | 0 refills | Status: DC

## 2023-03-16 NOTE — Progress Notes (Signed)
   Virtual Visit via Video Note  I connected with  Sharyn Lull  on 03/16/23 at 11:00 AM EST by a video enabled telemedicine application and verified that I am speaking with the correct person using two identifiers.  Location: Patient: home Provider: Nature conservation officer at [employer] Persons present: Patient and myself   I discussed the limitations of evaluation and management by telemedicine and the availability of in person appointments. The patient expressed understanding and agreed to proceed.   History of Present Illness:   History of Present Illness   Kina Shiffman Ryle is a 28 year old female who presents with questions about her lab results.  She is concerned about her recent lab results, particularly her vitamin D levels, which are low. She currently takes a vitamin D supplement but did not receive the supplement that was sent in for her.  She is unsure about her cholesterol levels, recalling that they were either high or low, and seeks clarification. Her cholesterol is slightly elevated, but her HDL cholesterol is notably high. She engages in cardiovascular exercise, which may contribute to her high HDL levels.  Her B12 levels are reported to be good, and her iron levels are stable, which is significant given her history of iron issues. She had a question about her iron saturation ratio, which was explained as a transient finding not requiring intervention.        Observations/Objective:   Gen: Awake, alert, no acute distress Resp: Breathing is even and non-labored Psych: calm/pleasant demeanor Neuro: Alert and Oriented x 3, + facial symmetry, speech is clear.   Assessment and Plan:  Assessment and Plan    Vitamin D deficiency Vitamin D levels slightly low. Recommended higher dose supplement. - Prescribe higher dose vitamin D supplement once weekly for 10-12 weeks. - Send prescription to [facility] on [address]. Last vitamin D Lab Results   Component Value Date   VD25OH 26.88 (L) 03/05/2023     Hyperlipidemia Cholesterol slightly elevated, HDL very high. No medication needed. Emphasized dietary changes and exercise. - Encourage dietary modifications: increase fiber, whole grains, fruits, vegetables, oatmeal. - Recommend cardiovascular exercise to maintain high HDL levels.   Lab Results  Component Value Date   CHOL 236 (H) 03/05/2023   HDL 102.70 03/05/2023   LDLCALC 122 (H) 03/05/2023   TRIG 55.0 03/05/2023   CHOLHDL 2 03/05/2023        Iron def anemia -stable, no signs of issues on labs, reassured, continue supplement daily   Follow Up Instructions:    I discussed the assessment and treatment plan with the patient. The patient was provided an opportunity to ask questions and all were answered. The patient agreed with the plan and demonstrated an understanding of the instructions.   The patient was advised to call back or seek an in-person evaluation if the symptoms worsen or if the condition fails to improve as anticipated.  Verdis Koval M Jennifer Payes, PA-C

## 2023-03-26 ENCOUNTER — Ambulatory Visit (INDEPENDENT_AMBULATORY_CARE_PROVIDER_SITE_OTHER): Admitting: Physician Assistant

## 2023-03-26 ENCOUNTER — Encounter: Payer: Self-pay | Admitting: Physician Assistant

## 2023-03-26 VITALS — BP 112/60 | HR 84 | Temp 98.1°F | Ht 60.0 in | Wt 134.8 lb

## 2023-03-26 DIAGNOSIS — Z0184 Encounter for antibody response examination: Secondary | ICD-10-CM

## 2023-03-26 DIAGNOSIS — Z111 Encounter for screening for respiratory tuberculosis: Secondary | ICD-10-CM

## 2023-03-26 NOTE — Progress Notes (Signed)
 Patient ID: Angela Wolf, female    DOB: October 18, 1995, 28 y.o.   MRN: [id number]   Assessment & Plan:  Immunity status testing -     Measles/Mumps/Rubella Immunity -     Varicella zoster antibody, IgG -     Hepatitis B surface antibody,qualitative  Screening for tuberculosis -     QuantiFERON-TB Gold Plus   Assessment and Plan    Immunization Status Evaluation She requires immunization status evaluation for clinical placement. Recent vaccinations include influenza and tetanus. Verification of immunity to MMR, hepatitis B, and varicella is necessary. TB test required. - Order blood titers for MMR, hepatitis B, and varicella. - Order TB gold blood test.           Subjective:    Chief Complaint  Patient presents with   need labs/ vaccine    Pt is requesting to have labs done due to having clinicals at school. States had flu vaccine and tdap at [employer].    HPI Discussed the use of AI scribe software for clinical note transcription with the patient, who gave verbal consent to proceed.  History of Present Illness   Angela Wolf is a 28 year old female who presents for immunization updates required for clinical placement.  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 There is uncertainty regarding whether she received a tetanus shot, as she thought she received it at [facility] along with a flu shot, but she is unsure. She has paperwork that may clarify this.  She needs to have titers checked for measles, mumps, rubella (MMR), hepatitis B, and varicella to determine her immunity status. She does not recall having these vaccines since she was 28 years old, and her mother managed her vaccinations at that time. She is unsure if she has records of these vaccinations.  Additionally, she requires a tuberculosis test. She was informed that she could return on Monday  for the second part of the test, but she is also open to having a TB blood test done if labs are being drawn.  She graduated from [employer] and is currently attending [employer]. She completed an online phlebotomy program at [facility] last semester.       Past Medical History:  Diagnosis Date   Allergy    Anemia    Asthma    Depression    Panic attacks     History reviewed. No pertinent surgical history.  Family History  Problem Relation Age of Onset   Healthy Mother    Anxiety disorder Mother    Hypertension Father    Asthma Father    Fibromyalgia Father    Kidney disease Father    Diabetes Father    Depression Father    Depression Sister    Irritable bowel syndrome Sister    Depression Brother    ODD Brother    Alzheimer's disease Maternal Grandfather    Alzheimer's disease Paternal Grandmother    Blindness Paternal Grandfather    Alzheimer's disease Paternal Grandfather     Social History   Tobacco Use   Smoking status: Never   Smokeless tobacco: Never  Substance Use Topics   Alcohol use: Not Currently    Alcohol/week: 1.0 standard drink of alcohol    Types: 1 Standard drinks or equivalent per week   Drug use: Never     Allergies  Allergen Reactions   Acetaminophen-Caffeine Other (See Comments)  Makes her angry   Aspirin-Acetaminophen Other (See Comments)   Aspirin-Acetaminophen-Caffeine Other (See Comments)   Blue Crab (Cavinectes Sapidus) Allergy Skin Test Hives    Review of Systems NEGATIVE UNLESS OTHERWISE INDICATED IN HPI      Objective:     BP 112/60   Pulse 84   Temp 98.1 F (36.7 C) (Temporal)   Ht 5' (1.524 m)   Wt 134 lb 12.8 oz (61.1 kg)   SpO2 98%   BMI 26.33 kg/m   Wt Readings from Last 3 Encounters:  03/26/23 134 lb 12.8 oz (61.1 kg)  03/16/23 131 lb (59.4 kg)  03/05/23 131 lb (59.4 kg)    BP Readings from Last 3 Encounters:  03/26/23 112/60  03/05/23 100/70  02/06/23 118/78     Physical Exam Vitals and nursing  note reviewed.  Constitutional:      Appearance: Normal appearance.  Eyes:     Pupils: Pupils are equal, round, and reactive to light.  Neurological:     General: No focal deficit present.     Mental Status: She is alert.  Psychiatric:        Mood and Affect: Mood normal.        Behavior: Behavior normal.       Time Spent: 12 minutes of total time was spent on the date of the encounter performing the following actions: chart review prior to seeing the patient, obtaining history, performing a medically necessary exam, counseling on the treatment plan, placing orders, and documenting in our EHR.       Ginamarie Banfield M Indea Dearman, PA-C

## 2023-03-28 ENCOUNTER — Encounter: Payer: Self-pay | Admitting: Physician Assistant

## 2023-03-29 ENCOUNTER — Ambulatory Visit (INDEPENDENT_AMBULATORY_CARE_PROVIDER_SITE_OTHER)

## 2023-03-29 DIAGNOSIS — Z23 Encounter for immunization: Secondary | ICD-10-CM | POA: Diagnosis not present

## 2023-03-29 NOTE — Progress Notes (Signed)
 Patient is in office today for a nurse visit for Immunization, per PCP's order. Patient Injection was given in the  Right deltoid. Patient tolerated injection well. Patient was advised to schedule 1 month appt to come back to complete series.

## 2023-03-30 ENCOUNTER — Telehealth: Payer: Self-pay | Admitting: Physician Assistant

## 2023-03-30 LAB — MEASLES/MUMPS/RUBELLA IMMUNITY
Mumps IgG: 93.1 [AU]/ml
Rubella: 1.6 {index}
Rubeola IgG: 137 [AU]/ml

## 2023-03-30 LAB — VARICELLA ZOSTER ANTIBODY, IGG: Varicella IgG: 11.2 {s_co_ratio}

## 2023-03-30 LAB — QUANTIFERON-TB GOLD PLUS
Mitogen-NIL: 2.75 [IU]/mL
NIL: 0.02 [IU]/mL
QuantiFERON-TB Gold Plus: NEGATIVE
TB1-NIL: 0 [IU]/mL
TB2-NIL: 0 [IU]/mL

## 2023-03-30 LAB — HEPATITIS B SURFACE ANTIBODY,QUALITATIVE: Hep B S Ab: NONREACTIVE

## 2023-03-30 NOTE — Telephone Encounter (Unsigned)
 Copied from CRM [id number]. Topic: General - Other >> Mar 30, 2023  3:35 PM Angela Wolf wrote: Reason for CRM: Patient is requesting Wolf PDF for her immunization record, with her name and dob on it - through email or she will pick it up.   Callback number: [phone]

## 2023-03-30 NOTE — Telephone Encounter (Signed)
 [facility] printed and in front office for pickup

## 2023-04-01 ENCOUNTER — Ambulatory Visit: Admitting: Physician Assistant

## 2023-04-28 ENCOUNTER — Other Ambulatory Visit: Payer: Self-pay

## 2023-04-28 ENCOUNTER — Telehealth: Payer: Self-pay

## 2023-04-28 DIAGNOSIS — Z23 Encounter for immunization: Secondary | ICD-10-CM

## 2023-04-28 NOTE — Telephone Encounter (Signed)
 Copied from CRM [id number]. Topic: Appointments - Appointment Cancel/Reschedule >> Apr 28, 2023  2:48 PM Jethro Morrison wrote: Patient/patient representative is calling to cancel or reschedule an appointment. Refer to attachments for appointment information. The pt called stated she will not make it for her second HPV shot tomorrow and would like to reschedule. Forwarding message to clinic. Thanks  Returned pt call and was advised she has class and needs to reschedule. Pt advised she could be here at 8am for nurse visit and advised I would leave her on schedule as is and I will take care of her first thing in the am. Pt verbalized understanding.

## 2023-04-29 ENCOUNTER — Ambulatory Visit (INDEPENDENT_AMBULATORY_CARE_PROVIDER_SITE_OTHER)

## 2023-04-29 DIAGNOSIS — Z23 Encounter for immunization: Secondary | ICD-10-CM | POA: Diagnosis not present

## 2023-04-29 NOTE — Progress Notes (Signed)
 Patient is in office today for a nurse visit for Immunization, per PCP's order. Patient Injection was given in the  Left deltoid. Patient tolerated injection well.

## 2023-05-20 ENCOUNTER — Telehealth: Payer: Self-pay

## 2023-05-20 ENCOUNTER — Telehealth: Payer: Self-pay | Admitting: Physician Assistant

## 2023-05-20 NOTE — Telephone Encounter (Unsigned)
 Copied from CRM [id number]. Topic: Medical Record Request - Other >> May 19, 2023  4:58 PM Alyse July wrote: Reason for CRM: Patient would like to know if a copy of her TB test can be emailed to her  [email or web address]  and also if the office offers the Varicella vaccine. Please contact patient to advise [phone]

## 2023-05-20 NOTE — Telephone Encounter (Signed)
 Noted I will make sure patient is aware

## 2023-05-20 NOTE — Telephone Encounter (Signed)
 Copied from CRM [id number]. Topic: Appointments - Scheduling Inquiry for Clinic >> May 20, 2023  9:36 AM Jethro Morrison wrote: Reason for CRM: PT CALLED BACK STATED SHE WOULD LIKE THE Varicella SHOT AND SHE WOULD LIKE A PRINT OUT OF HER RESULTS.  Returned pt call; pt requesting a nurse visit for Varicella immunization. Advised pt titers completed showing immunity however pt still wanting to receive. Nurse visit scheduled and advised will get PCP approval and if anything changes I would call her back and let her know. Pt verbalized understanding. Pt will pick up copy of Quantiferon Gold results in office at visit.

## 2023-05-20 NOTE — Telephone Encounter (Signed)
 Returned pt call and lvm advising unable to email results due to privacy and policy, advised available vis MyChart for her to print or happy to print for her to pick up in office. Also titer results show immunity to Varicella so not needed but if still required the office does have tis on hand to administer. Advised can return call or MyChart message if anything further is needed.

## 2023-05-25 ENCOUNTER — Ambulatory Visit

## 2023-05-25 DIAGNOSIS — Z23 Encounter for immunization: Secondary | ICD-10-CM

## 2023-05-25 NOTE — Progress Notes (Signed)
 Patient is in office today for a nurse visit for Immunization, per PCP's order. Patient Injection was given in the  Left deltoid. Patient tolerated injection well. Pt aware today and prior to visit this may or may not be covered by insurance since showing immunity in labs.

## 2023-08-10 ENCOUNTER — Ambulatory Visit: Payer: Self-pay | Admitting: Physician Assistant

## 2023-08-10 VITALS — BP 118/78 | HR 60 | Temp 98.1°F | Ht 60.0 in | Wt 133.8 lb

## 2023-08-10 DIAGNOSIS — B3789 Other sites of candidiasis: Secondary | ICD-10-CM | POA: Diagnosis not present

## 2023-08-10 DIAGNOSIS — L309 Dermatitis, unspecified: Secondary | ICD-10-CM

## 2023-08-10 DIAGNOSIS — M62838 Other muscle spasm: Secondary | ICD-10-CM

## 2023-08-10 DIAGNOSIS — K602 Anal fissure, unspecified: Secondary | ICD-10-CM

## 2023-08-10 DIAGNOSIS — M79631 Pain in right forearm: Secondary | ICD-10-CM | POA: Diagnosis not present

## 2023-08-10 MED ORDER — DICLOFENAC SODIUM 1 % EX GEL: 2.0000 g | Freq: Four times a day (QID) | CUTANEOUS | 2 refills | Status: AC

## 2023-08-10 MED ORDER — MOMETASONE FUROATE 0.1 % EX CREA: TOPICAL_CREAM | Freq: Every day | CUTANEOUS | 2 refills | Status: DC

## 2023-08-10 MED ORDER — NYSTATIN 100000 UNIT/GM EX CREA: 1.0000 | TOPICAL_CREAM | Freq: Two times a day (BID) | CUTANEOUS | 0 refills | Status: DC
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
## 2023-08-11 ENCOUNTER — Telehealth: Payer: Self-pay | Admitting: Physician Assistant

## 2023-08-11 NOTE — Telephone Encounter (Signed)
 Patient dropped off document Health Exam Form, to be filled out by provider. Patient requested to send it back via Call Patient to pick up within ASAP. Document is located in providers tray at front office.Please advise at Mobile [phone] (mobile)

## 2023-08-11 NOTE — Telephone Encounter (Signed)
 Form received and printed Quantiferon Gold lab results for proof of TB screening, advised patient form states no stamps and needs PCP signature and she will not be back in office until Thursday morning. Advised pt we would call and let her know if PCP agreeable to signing and completing form.

## 2023-08-12 NOTE — Telephone Encounter (Signed)
 Spoke w/pt to make her aware forms are at front desk ready for pick up.

## 2023-09-03 ENCOUNTER — Encounter: Payer: 59 | Admitting: Physician Assistant

## 2023-10-28 ENCOUNTER — Ambulatory Visit (INDEPENDENT_AMBULATORY_CARE_PROVIDER_SITE_OTHER): Payer: Self-pay

## 2023-10-28 ENCOUNTER — Encounter (HOSPITAL_COMMUNITY): Payer: Self-pay

## 2023-10-28 ENCOUNTER — Ambulatory Visit (HOSPITAL_COMMUNITY)
Admission: EM | Admit: 2023-10-28 | Discharge: 2023-10-28 | Disposition: A | Discharge status: Home / Self Care | Payer: Self-pay | Attending: Emergency Medicine | Admitting: Emergency Medicine

## 2023-10-28 DIAGNOSIS — N898 Other specified noninflammatory disorders of vagina: Secondary | ICD-10-CM | POA: Insufficient documentation

## 2023-10-28 DIAGNOSIS — N760 Acute vaginitis: Secondary | ICD-10-CM | POA: Insufficient documentation

## 2023-10-28 DIAGNOSIS — R051 Acute cough: Secondary | ICD-10-CM | POA: Insufficient documentation

## 2023-10-28 DIAGNOSIS — J209 Acute bronchitis, unspecified: Secondary | ICD-10-CM | POA: Insufficient documentation

## 2023-10-28 DIAGNOSIS — B356 Tinea cruris: Secondary | ICD-10-CM | POA: Insufficient documentation

## 2023-10-28 LAB — CERVICOVAGINAL ANCILLARY ONLY
Bacterial Vaginitis (gardnerella): NEGATIVE
Candida Glabrata: NEGATIVE
Candida Vaginitis: POSITIVE — AB
Chlamydia: NEGATIVE
Comment: NEGATIVE
Comment: NEGATIVE
Comment: NEGATIVE
Comment: NEGATIVE
Comment: NEGATIVE
Comment: NORMAL
Neisseria Gonorrhea: NEGATIVE
Trichomonas: NEGATIVE

## 2023-10-28 MED ORDER — NYSTATIN 100000 UNIT/GM EX CREA: TOPICAL_CREAM | CUTANEOUS | 0 refills | Status: AC

## 2023-10-28 MED ORDER — FLUCONAZOLE 150 MG PO TABS
ORAL_TABLET | ORAL | 0 refills | Status: AC
Start: 2023-10-28 — End: ?

## 2023-10-28 MED ORDER — BENZONATATE 100 MG PO CAPS: 100.0000 mg | ORAL_CAPSULE | Freq: Three times a day (TID) | ORAL | 0 refills | Status: AC

## 2023-10-28 MED ORDER — PROMETHAZINE-DM 6.25-15 MG/5ML PO SYRP: 5.0000 mL | ORAL_SOLUTION | Freq: Every evening | ORAL | 0 refills | Status: AC | PRN

## 2023-10-28 MED ORDER — PREDNISONE 20 MG PO TABS: 40.0000 mg | ORAL_TABLET | Freq: Every day | ORAL | 0 refills | Status: AC

## 2023-10-28 NOTE — Discharge Instructions (Addendum)
 Your x-ray is negative for any underlying pneumonia.  I believe your cough is coming from some underlying bronchitis.   I have prescribed prednisone  for you to take 2 tablets once daily for 5 days to help with this. You can also take Tessalon every 8 hours as needed for cough. You can also take Promethazine DM cough syrup at bedtime as needed for cough. Also recommend taking over-the-counter Mucinex to help with cough and congestion.  We have performed a swab today and these results will come back in a few days and someone will call if results require any additional treatment. Otherwise take 1 tablet of Diflucan today and another tablet in 3 days if your symptoms continue. Also start applying nystatin  cream to the areas of irritation outside of the vagina. Follow-up with your primary care provider or return here as needed.

## 2023-10-28 NOTE — ED Triage Notes (Signed)
 Pt c/o cough, body aches, and fatigue x3wks. States taking Therma flu but sx's getting worse.  Pt c/o vaginal pain d/t skin is raw x2 days. States thinks she is having a allergic reaction to the different wipes she was using. States putting Vaseline on it with no relief.   Pt states earlier this week she started having early sx's of a yeast infection and took Diflucan and sx's are now gone.

## 2023-10-28 NOTE — ED Provider Notes (Signed)
 [facility]    CSN: [id number] Arrival date & time: 10/28/23  0813      History   Chief Complaint Chief Complaint  Patient presents with   Cough   Vaginal Pain    HPI Angela Wolf is a 28 y.o. female.   Patient presents with persistent cough, body aches, and fatigue for about 3 weeks.  Patient states the cough has worsened over the last few weeks.  Denies chest pain, shortness of breath, nausea, vomiting, diarrhea, and known fever.  Patient states that she has been taking TheraFlu with minimal relief.  Patient also reports vaginal itching and rawness after using baby wipes and is concerned that she may be having allergic reaction to these.  Patient states that she has been applying Vaseline with no relief.  Patient states that earlier this week she was having some symptoms of a yeast infection and she did take Diflucan which helped to relieve this, but continues to have vaginal itching and rawness.  The history is provided by the patient and medical records.  Cough Vaginal Pain    Past Medical History:  Diagnosis Date   Allergy    Anemia    Asthma    Depression    Panic attacks     Patient Active Problem List   Diagnosis Date Noted   Mood disorder 03/05/2023   B12 deficiency 03/05/2023   Family history of diabetes mellitus 03/05/2023   Attention deficit hyperactivity disorder, combined type 06/18/2021   Foreign body in hand 04/24/2021   Posttraumatic stress disorder 03/04/2021   Generalized anxiety disorder 12/25/2020   Bipolar disorder (HCC) 02/03/2019   Iron deficiency anemia 02/03/2019   Vitamin D  deficiency 02/03/2019   Eczema 08/12/2015   Irregular menstrual bleeding 08/12/2015    History reviewed. No pertinent surgical history.  OB History   No obstetric history on file.      Home Medications    Prior to Admission medications   Medication Sig Start Date End Date Taking? Authorizing Provider  benzonatate (TESSALON) 100 MG  capsule Take 1 capsule (100 mg total) by mouth every 8 (eight) hours. 10/28/23  Yes Johnie Flaming A, NP  fluconazole (DIFLUCAN) 150 MG tablet Take one tablet today and one tablet in 3 days if symptoms persist. 10/28/23  Yes Johnie Flaming A, NP  nystatin  cream (MYCOSTATIN ) Apply to affected area 2 times daily 10/28/23  Yes Johnie Flaming A, NP  predniSONE  (DELTASONE ) 20 MG tablet Take 2 tablets (40 mg total) by mouth daily for 5 days. 10/28/23 11/02/23 Yes Johnpatrick Jenny A, NP  promethazine-dextromethorphan (PROMETHAZINE-DM) 6.25-15 MG/5ML syrup Take 5 mLs by mouth at bedtime as needed for cough. 10/28/23  Yes Johnie, Margues Filippini A, NP  amitriptyline  (ELAVIL ) 10 MG tablet Take 1 tablet (10 mg total) by mouth at bedtime. 02/22/20   White, Shelba SAUNDERS, NP  amphetamine-dextroamphetamine (ADDERALL) 10 MG tablet Take 10 mg by mouth 2 (two) times daily. 07/19/23   [provider]  amphetamine-dextroamphetamine (ADDERALL) 20 MG tablet Take 20 mg by mouth daily.    [provider]  diclofenac  Sodium (VOLTAREN  ARTHRITIS PAIN) 1 % GEL Apply 2 g topically 4 (four) times daily. 08/10/23   Allwardt, Alyssa M, PA-C  Lisdexamfetamine Dimesylate 20 MG CHEW Chew by mouth. Patient not taking: Reported on 08/10/2023 01/15/23   [provider]  lurasidone (LATUDA) 20 MG TABS tablet Take 20 mg by mouth daily. Patient taking differently: Take 40 mg by mouth daily.    [provider]    Family History Family History  Problem Relation Age of Onset   Healthy Mother    Anxiety disorder Mother    Hypertension Father    Asthma Father    Fibromyalgia Father    Kidney disease Father    Diabetes Father    Depression Father    Depression Sister    Irritable bowel syndrome Sister    Depression Brother    ODD Brother    Alzheimer's disease Maternal Grandfather    Alzheimer's disease Paternal Grandmother    Blindness Paternal Grandfather    Alzheimer's disease Paternal Grandfather      Social History Social History   Tobacco Use   Smoking status: Never   Smokeless tobacco: Never  Substance Use Topics   Alcohol use: Not Currently    Alcohol/week: 1.0 standard drink of alcohol    Types: 1 Standard drinks or equivalent per week   Drug use: Never     Allergies   Acetaminophen -caffeine, Aspirin-acetaminophen , Aspirin-acetaminophen -caffeine, and Blue crab (cavinectes sapidus) allergy skin test   Review of Systems Review of Systems  Respiratory:  Positive for cough.   Genitourinary:  Positive for vaginal pain.   Per HPI  Physical Exam Triage Vital Signs ED Triage Vitals [10/28/23 0857]  Encounter Vitals Group     BP 92/62     Girls Systolic BP Percentile      Girls Diastolic BP Percentile      Boys Systolic BP Percentile      Boys Diastolic BP Percentile      Pulse Rate 86     Resp 18     Temp 98.8 F (37.1 C)     Temp Source Oral     SpO2 97 %     Weight      Height      Head Circumference      Peak Flow      Pain Score      Pain Loc      Pain Education      Exclude from Growth Chart    No data found.  Updated Vital Signs BP 92/62 (BP Location: Left Arm)   Pulse 86   Temp 98.8 F (37.1 C) (Oral)   Resp 18   LMP 10/03/2023 (Exact Date)   SpO2 97%   Visual Acuity Right Eye Distance:   Left Eye Distance:   Bilateral Distance:    Right Eye Near:   Left Eye Near:    Bilateral Near:     Physical Exam Vitals and nursing note reviewed. Exam conducted with a chaperone present.  Constitutional:      General: She is awake. She is not in acute distress.    Appearance: Normal appearance. She is well-developed and well-groomed. She is not ill-appearing.  HENT:     Right Ear: Tympanic membrane, ear canal and external ear normal.     Left Ear: Tympanic membrane, ear canal and external ear normal.     Nose: Congestion present. No rhinorrhea.     Mouth/Throat:     Mouth: Mucous membranes are moist.     Pharynx: Oropharynx is clear.  Posterior oropharyngeal erythema and postnasal drip present. No oropharyngeal exudate.  Cardiovascular:     Rate and Rhythm: Normal rate and regular rhythm.  Pulmonary:     Effort: Pulmonary effort is normal.     Breath sounds: Normal breath sounds.  Genitourinary:    Labia:        Right: No rash or  lesion.        Left: No rash or lesion.      Vagina: No signs of injury. Vaginal discharge present. No tenderness or bleeding.     Comments: Mild erythema noted to bilateral labia majora.  Small tear from friction noted to the base of the gluteal cleft with surrounding erythema and moisture concerning for tinea cruris.  Milky white discharge noted just inside vaginal opening. Skin:    General: Skin is warm and dry.  Neurological:     Mental Status: She is alert.  Psychiatric:        Behavior: Behavior is cooperative.      UC Treatments / Results  Labs (all labs ordered are listed, but only abnormal results are displayed) Labs Reviewed  CERVICOVAGINAL ANCILLARY ONLY    EKG   Radiology DG Chest 2 View Result Date: 10/28/2023 EXAM: 2 VIEW(S) XRAY OF THE CHEST 10/28/2023 09:39:24 AM COMPARISON: 01/05/225 CLINICAL HISTORY: cough x 3 weeks. Pt c/o cough, body aches, and fatigue x3wks. States taking Therma flu but sx's getting worse. FINDINGS: LUNGS AND PLEURA: No focal pulmonary opacity. No pulmonary edema. No pleural effusion. No pneumothorax. HEART AND MEDIASTINUM: No acute abnormality of the cardiac and mediastinal silhouettes. BONES AND SOFT TISSUES: No acute osseous abnormality. IMPRESSION: 1. No acute cardiopulmonary process detected. Electronically signed by: Lynwood Seip MD 10/28/2023 09:42 AM EDT RP Workstation: [id number]    Procedures Procedures (including critical care time)  Medications Ordered in UC Medications - No data to display  Initial Impression / Assessment and Plan / UC Course  I have reviewed the triage vital signs and the nursing notes.  Pertinent labs &  imaging results that were available during my care of the patient were reviewed by me and considered in my medical decision making (see chart for details).     Patient is overall well-appearing.  Vitals are stable.  Mild congestion present, erythema and PND noted to posterior oropharynx.  Lungs clear bilaterally to auscultation.  Order chest x-ray to rule out underlying pneumonia due to ongoing cough that is worsening.  I independently interpreted these images and there is no active cardiopulmonary disease.  Symptoms likely related to underlying bronchitis.  Prescribed short prednisone  burst to help with this.  Prescribed Tessalon and Promethazine DM as needed for cough.  Discussed over-the-counter medications as needed for cough.  On GU exam there was some vaginal discharge present we have collected a swab to rule out STD/STI related to this.  There is also evidence of tinea cruris to this area.  Prescribed Diflucan vaginitis coverage.  Prescribed nystatin  cream for tinea cruris.  Discussed follow-up and return precautions. Final Clinical Impressions(s) / UC Diagnoses   Final diagnoses:  Acute cough  Acute vaginitis  Vaginal discharge  Tinea cruris  Acute bronchitis, unspecified organism     Discharge Instructions      Your x-ray is negative for any underlying pneumonia.  I believe your cough is coming from some underlying bronchitis.   I have prescribed prednisone  for you to take 2 tablets once daily for 5 days to help with this. You can also take Tessalon every 8 hours as needed for cough. You can also take Promethazine DM cough syrup at bedtime as needed for cough. Also recommend taking over-the-counter Mucinex to help with cough and congestion.  We have performed a swab today and these results will come back in a few days and someone will call if results require any additional treatment. Otherwise take 1  tablet of Diflucan today and another tablet in 3 days if your symptoms  continue. Also start applying nystatin  cream to the areas of irritation outside of the vagina. Follow-up with your primary care provider or return here as needed.     ED Prescriptions     Medication Sig Dispense Auth. Provider   fluconazole (DIFLUCAN) 150 MG tablet Take one tablet today and one tablet in 3 days if symptoms persist. 2 tablet Johnie Flaming A, NP   nystatin  cream (MYCOSTATIN ) Apply to affected area 2 times daily 30 g Johnie Flaming A, NP   predniSONE  (DELTASONE ) 20 MG tablet Take 2 tablets (40 mg total) by mouth daily for 5 days. 10 tablet Johnie Flaming A, NP   promethazine-dextromethorphan (PROMETHAZINE-DM) 6.25-15 MG/5ML syrup Take 5 mLs by mouth at bedtime as needed for cough. 118 mL Johnie, Brigido Mera A, NP   benzonatate (TESSALON) 100 MG capsule Take 1 capsule (100 mg total) by mouth every 8 (eight) hours. 21 capsule Johnie Flaming A, NP      PDMP not reviewed this encounter.   Johnie Flaming A, NP 10/28/23 1004

## 2023-10-29 ENCOUNTER — Encounter (HOSPITAL_BASED_OUTPATIENT_CLINIC_OR_DEPARTMENT_OTHER): Payer: Self-pay

## 2023-10-29 ENCOUNTER — Emergency Department (HOSPITAL_BASED_OUTPATIENT_CLINIC_OR_DEPARTMENT_OTHER)
Admission: EM | Admit: 2023-10-29 | Discharge: 2023-10-29 | Disposition: A | Discharge status: Home / Self Care | Payer: Self-pay | Attending: Emergency Medicine | Admitting: Emergency Medicine

## 2023-10-29 ENCOUNTER — Ambulatory Visit (HOSPITAL_COMMUNITY): Payer: Self-pay

## 2023-10-29 DIAGNOSIS — N898 Other specified noninflammatory disorders of vagina: Secondary | ICD-10-CM | POA: Insufficient documentation

## 2023-10-29 LAB — WET PREP, GENITAL
Clue Cells Wet Prep HPF POC: NONE SEEN
Sperm: NONE SEEN
Trich, Wet Prep: NONE SEEN
WBC, Wet Prep HPF POC: <10 (ref <10)
Yeast Wet Prep HPF POC: NONE SEEN

## 2023-10-29 LAB — URINALYSIS, ROUTINE W REFLEX MICROSCOPIC
Bacteria, UA: NONE SEEN
Bilirubin Urine: NEGATIVE
Glucose, UA: NEGATIVE mg/dL
Ketones, ur: NEGATIVE mg/dL
Nitrite: NEGATIVE
Specific Gravity, Urine: 1.034 — ABNORMAL HIGH (ref 1.005–1.030)
pH: 6 (ref 5.0–8.0)

## 2023-10-29 MED ORDER — LIDOCAINE 5 % EX OINT: TOPICAL_OINTMENT | CUTANEOUS | 0 refills | Status: AC

## 2023-10-29 MED ORDER — VALACYCLOVIR HCL 1 G PO TABS: 1000.0000 mg | ORAL_TABLET | Freq: Two times a day (BID) | ORAL | 0 refills | Status: AC

## 2023-10-29 NOTE — ED Triage Notes (Signed)
 Pt suspects she has 'burns' from monostat she used to treat yeast infection. Advises she has raised sores/ blisters from top of my clitoris to where my back/ butt meet. Associated pain, burning, tenderness/ raw feeling.

## 2023-10-29 NOTE — ED Notes (Signed)
 Pt unable to provide a urine sample at this time. Pt has specimen cup.

## 2023-10-30 NOTE — ED Provider Notes (Signed)
 [place] EMERGENCY DEPARTMENT AT [facility] Provider Note   CSN: [id number] Arrival date & time: 10/29/23  1629     Patient presents with: Vaginal Itching   Angela Wolf is a 28 y.o. female.   Patient complains of vaginal irritation and vaginal sores.  Patient reports that she was evaluated at urgent care and had STD testing.  Patient is concerned because she thinks the sores are getting worse.  Patient reports the areas are painful.  Patient states that she used Monistat because she thought she had a yeast infection.  Patient reports that the irritation became worse after using Monistat.  Patient denies any fever or chills.  Patient is concerned about herpetic infection.  The history is provided by the patient. No language interpreter was used.  Vaginal Itching       Prior to Admission medications   Medication Sig Start Date End Date Taking? Authorizing Provider  lidocaine (XYLOCAINE) 5 % ointment Apply a thin layer to area of irritation every 4 hours 10/29/23  Yes Kahlee Metivier K, PA-C  valACYclovir (VALTREX) 1000 MG tablet Take 1 tablet (1,000 mg total) by mouth 2 (two) times daily for 10 days. 10/29/23 11/08/23 Yes Flint Sonny POUR, PA-C  amitriptyline  (ELAVIL ) 10 MG tablet Take 1 tablet (10 mg total) by mouth at bedtime. 02/22/20   White, Shelba SAUNDERS, NP  amphetamine-dextroamphetamine (ADDERALL) 10 MG tablet Take 10 mg by mouth 2 (two) times daily. 07/19/23   [provider]  amphetamine-dextroamphetamine (ADDERALL) 20 MG tablet Take 20 mg by mouth daily.    [provider]  benzonatate (TESSALON) 100 MG capsule Take 1 capsule (100 mg total) by mouth every 8 (eight) hours. 10/28/23   Johnie Flaming A, NP  diclofenac  Sodium (VOLTAREN  ARTHRITIS PAIN) 1 % GEL Apply 2 g topically 4 (four) times daily. 08/10/23   Allwardt, Alyssa M, PA-C  fluconazole (DIFLUCAN) 150 MG tablet Take one tablet today and one tablet in 3 days if symptoms persist. 10/28/23    Johnie Flaming A, NP  Lisdexamfetamine Dimesylate 20 MG CHEW Chew by mouth. Patient not taking: Reported on 08/10/2023 01/15/23   [provider]  lurasidone (LATUDA) 20 MG TABS tablet Take 20 mg by mouth daily. Patient taking differently: Take 40 mg by mouth daily.    [provider]  nystatin  cream (MYCOSTATIN ) Apply to affected area 2 times daily 10/28/23   Johnie Flaming A, NP  predniSONE  (DELTASONE ) 20 MG tablet Take 2 tablets (40 mg total) by mouth daily for 5 days. 10/28/23 11/02/23  Johnie Flaming A, NP  promethazine-dextromethorphan (PROMETHAZINE-DM) 6.25-15 MG/5ML syrup Take 5 mLs by mouth at bedtime as needed for cough. 10/28/23   Johnie Flaming A, NP    Allergies: Acetaminophen -caffeine, Aspirin-acetaminophen , Aspirin-acetaminophen -caffeine, and Blue crab (cavinectes sapidus) allergy skin test    Review of Systems  All other systems reviewed and are negative.   Updated Vital Signs BP 116/77 (BP Location: Right Arm)   Pulse 80   Temp 98 F (36.7 C) (Oral)   Resp 16   LMP 10/03/2023 (Exact Date)   SpO2 99%   Physical Exam Vitals and nursing note reviewed.  Constitutional:      Appearance: She is well-developed.  HENT:     Head: Normocephalic.  Cardiovascular:     Rate and Rhythm: Normal rate.  Pulmonary:     Effort: Pulmonary effort is normal.  Abdominal:     General: There is no distension.  Genitourinary:    Comments: No obvious  herpetic lesions, erythematous areas, no blistering Musculoskeletal:        General: Normal range of motion.     Cervical back: Normal range of motion.  Skin:    General: Skin is warm.  Neurological:     General: No focal deficit present.     Mental Status: She is alert and oriented to person, place, and time.     (all labs ordered are listed, but only abnormal results are displayed) Labs Reviewed  URINALYSIS, ROUTINE W REFLEX MICROSCOPIC - Abnormal; Notable for the following components:      Result  Value   Specific Gravity, Urine 1.034 (*)    Hgb urine dipstick LARGE (*)    Protein, ur TRACE (*)    Leukocytes,Ua SMALL (*)    All other components within normal limits  WET PREP, GENITAL    EKG: None  Radiology: No results found.   Procedures   Medications Ordered in the ED - No data to display                                  Medical Decision Making Patient complains of vaginal lesions  Amount and/or Complexity of Data Reviewed Labs: ordered. Decision-making details documented in ED Course.    Details: Wet prep ordered reviewed and interpreted and shows no evidence of current yeast infection.  Patient recently had GC chlamydia and STD testing.  Herpes culture ordered  Risk Prescription drug management. Risk Details: Patient counseled on findings.  Patient has vaginal irritation I did not see anything that looks like a obvious herpes infection.  Patient would like to go ahead and start valacyclovir.  Patient is given a prescription.  She is advised to follow-up with primary care for recheck        Final diagnoses:  Vaginal irritation    ED Discharge Orders          Ordered    valACYclovir (VALTREX) 1000 MG tablet  2 times daily        10/29/23 2117    lidocaine (XYLOCAINE) 5 % ointment        10/29/23 2117           An After Visit Summary was printed and given to the patient.     Flint Sonny POUR, PA-C 10/30/23 2359    Zackowski, Scott, MD 10/31/23 1227

## 2023-10-31 ENCOUNTER — Telehealth (HOSPITAL_BASED_OUTPATIENT_CLINIC_OR_DEPARTMENT_OTHER): Payer: Self-pay | Admitting: Emergency Medicine

## 2023-10-31 ENCOUNTER — Emergency Department (HOSPITAL_BASED_OUTPATIENT_CLINIC_OR_DEPARTMENT_OTHER)
Admission: EM | Admit: 2023-10-31 | Discharge: 2023-10-31 | Disposition: A | Discharge status: Home / Self Care | Payer: Self-pay | Attending: Emergency Medicine | Admitting: Emergency Medicine

## 2023-10-31 ENCOUNTER — Encounter (HOSPITAL_BASED_OUTPATIENT_CLINIC_OR_DEPARTMENT_OTHER): Payer: Self-pay | Admitting: Emergency Medicine

## 2023-10-31 DIAGNOSIS — N762 Acute vulvitis: Secondary | ICD-10-CM | POA: Insufficient documentation

## 2023-10-31 MED ORDER — ACETAMINOPHEN 325 MG PO TABS
650.0000 mg | ORAL_TABLET | Freq: Once | ORAL | Status: AC
Start: 2023-10-31 — End: 2023-10-31
  Administered 2023-10-31: 650 mg via ORAL
  Filled 2023-10-31: qty 2

## 2023-10-31 MED ORDER — KETOCONAZOLE 2 % EX CREA: 1.0000 | TOPICAL_CREAM | Freq: Every day | CUTANEOUS | 0 refills | Status: DC

## 2023-10-31 NOTE — Discharge Instructions (Signed)
 How is this prevented? Use mild, unscented products. Avoid the following products if they are scented: Sprays. Detergents. Tampons. Products for cleaning the vagina. Soaps or bubble baths. Let air reach your genital area. To do this: Wear cotton underwear. Do not wear underwear while you sleep. Do not wear tight pants and underwear or pantyhose without a cotton panel. Do not wear thong underwear. Take off any wet clothing, such as bathing suits, as soon as possible. Practice safe sex. Use condoms. Contact a health care provider if: You have pain in the belly or around the pelvis. You have a fever or chills. You have symptoms that last for more than 2-3 days.

## 2023-10-31 NOTE — ED Triage Notes (Signed)
 Pt caox4 ambulatory NAD c/o worsening vaginal blisters worsening over the past week, was seen in ED and UC for same. Was also treated for yeast infection.

## 2023-10-31 NOTE — ED Provider Notes (Signed)
 [place] EMERGENCY DEPARTMENT AT [facility] Provider Note   CSN: [id number] Arrival date & time: 10/31/23  1015     Patient presents with: No chief complaint on file.   Angela Wolf is a 28 y.o. female with vaginal irriation here for her now 3rd visit to UC/ED for the same complaint. Seen yesterday and given valtrex and lidocaine ointment with a pending HSV smear. Patient reports her persistent vulvar irritation. She has not used any of the medications ordered for her yesterday. She has no new complaints. Review of EMR shows recent STI testing negative with positive candida infection Previously treated with Monistat and diflucan.   HPI     Prior to Admission medications   Medication Sig Start Date End Date Taking? Authorizing Provider  amitriptyline  (ELAVIL ) 10 MG tablet Take 1 tablet (10 mg total) by mouth at bedtime. 02/22/20   White, Shelba SAUNDERS, NP  amphetamine-dextroamphetamine (ADDERALL) 10 MG tablet Take 10 mg by mouth 2 (two) times daily. 07/19/23   [provider]  amphetamine-dextroamphetamine (ADDERALL) 20 MG tablet Take 20 mg by mouth daily.    [provider]  benzonatate (TESSALON) 100 MG capsule Take 1 capsule (100 mg total) by mouth every 8 (eight) hours. 10/28/23   Johnie Flaming A, NP  diclofenac  Sodium (VOLTAREN  ARTHRITIS PAIN) 1 % GEL Apply 2 g topically 4 (four) times daily. 08/10/23   Allwardt, Alyssa M, PA-C  fluconazole (DIFLUCAN) 150 MG tablet Take one tablet today and one tablet in 3 days if symptoms persist. 10/28/23   Johnie Flaming A, NP  lidocaine (XYLOCAINE) 5 % ointment Apply a thin layer to area of irritation every 4 hours 10/29/23   Sofia, Leslie K, PA-C  Lisdexamfetamine Dimesylate 20 MG CHEW Chew by mouth. Patient not taking: Reported on 08/10/2023 01/15/23   [provider]  lurasidone (LATUDA) 20 MG TABS tablet Take 20 mg by mouth daily. Patient taking differently: Take 40 mg by mouth daily.    [provider]  nystatin  cream (MYCOSTATIN ) Apply to affected area 2 times daily 10/28/23   Johnie Flaming A, NP  predniSONE  (DELTASONE ) 20 MG tablet Take 2 tablets (40 mg total) by mouth daily for 5 days. 10/28/23 11/02/23  Johnie Flaming A, NP  promethazine-dextromethorphan (PROMETHAZINE-DM) 6.25-15 MG/5ML syrup Take 5 mLs by mouth at bedtime as needed for cough. 10/28/23   Johnie Flaming A, NP  valACYclovir (VALTREX) 1000 MG tablet Take 1 tablet (1,000 mg total) by mouth 2 (two) times daily for 10 days. 10/29/23 11/08/23  Flint Sonny POUR, PA-C    Allergies: Acetaminophen -caffeine, Aspirin-acetaminophen , Aspirin-acetaminophen -caffeine, and Blue crab (cavinectes sapidus) allergy skin test    Review of Systems  Updated Vital Signs BP 119/77   Pulse 81   Temp 98.8 F (37.1 C)   Resp 18   Ht 5' (1.524 m)   Wt 59 kg   LMP 10/29/2023 (Exact Date)   SpO2 100%   BMI 25.39 kg/m   Physical Exam Vitals and nursing note reviewed. Exam conducted with a chaperone present [facility]).  Constitutional:      General: She is not in acute distress.    Appearance: She is well-developed. She is not diaphoretic.  HENT:     Head: Normocephalic and atraumatic.     Right Ear: External ear normal.     Left Ear: External ear normal.     Nose: Nose normal.     Mouth/Throat:     Mouth: Mucous membranes are moist.  Eyes:  General: No scleral icterus.    Conjunctiva/sclera: Conjunctivae normal.  Cardiovascular:     Rate and Rhythm: Normal rate and regular rhythm.     Heart sounds: Normal heart sounds. No murmur heard.    No friction rub. No gallop.  Pulmonary:     Effort: Pulmonary effort is normal. No respiratory distress.     Breath sounds: Normal breath sounds.  Abdominal:     General: Bowel sounds are normal. There is no distension.     Palpations: Abdomen is soft. There is no mass.     Tenderness: There is no abdominal tenderness. There is no guarding.  Genitourinary:     Comments: Skin around the outside of the vulva , perineum and anus/ gluteal cleft somewhat pale with some areas of maceration- suspect chronic tinea  there are no ulcerations or blisters, no discharge from the introitus. She has singular papules near the introitus and inferior labia minora.  Musculoskeletal:     Cervical back: Normal range of motion.  Skin:    General: Skin is warm and dry.  Neurological:     Mental Status: She is alert and oriented to person, place, and time.  Psychiatric:        Behavior: Behavior normal.     (all labs ordered are listed, but only abnormal results are displayed) Labs Reviewed  HSV CULTURE AND TYPING    EKG: None  Radiology: No results found.   Procedures   Medications Ordered in the ED - No data to display                                  Medical Decision Making Amount and/or Complexity of Data Reviewed Labs: ordered.  Risk OTC drugs. Prescription drug management.   + vaginitis Suspect tinea will discharge with Ketoconazole 2% cream May use lidocaine for further mgmt  of discomfort.  HSV pending. No other mucosal involvement doubt systemic issue like Behcets.. No emergent findings. Pelvic exam yesterday. Dc with return precautions and gyn follow up.      Final diagnoses:  None    ED Discharge Orders     None          Arloa Chroman, PA-C 10/31/23 1433    Dreama Longs, MD 11/01/23 1134

## 2023-11-01 ENCOUNTER — Telehealth (HOSPITAL_BASED_OUTPATIENT_CLINIC_OR_DEPARTMENT_OTHER): Payer: Self-pay | Admitting: Emergency Medicine

## 2023-11-01 MED ORDER — KETOCONAZOLE 2 % EX CREA: 1.0000 | TOPICAL_CREAM | Freq: Every day | CUTANEOUS | 0 refills | Status: AC

## 2023-11-01 NOTE — Telephone Encounter (Signed)
 I was notified by staff that the patient was requesting her prescription be updated to the [facility] on [address].  This was completed.

## 2023-11-03 LAB — HSV CULTURE AND TYPING
